# Patient Record
Sex: Female | Born: 1960 | Race: Black or African American | Hispanic: No | Marital: Single | Smoking: Never smoker
Health system: Southern US, Community
[De-identification: ages and names within clinical notes are randomized; demographics above are authoritative.]

## PROBLEM LIST (undated history)

## (undated) DIAGNOSIS — K219 Gastro-esophageal reflux disease without esophagitis: Secondary | ICD-10-CM

## (undated) DIAGNOSIS — D869 Sarcoidosis, unspecified: Secondary | ICD-10-CM

## (undated) HISTORY — PX: ABDOMINAL HYSTERECTOMY: SHX81

## (undated) HISTORY — DX: Gastro-esophageal reflux disease without esophagitis: K21.9

---

## 2007-09-25 ENCOUNTER — Encounter: Admission: RE | Admit: 2007-09-25 | Discharge: 2007-09-25 | Payer: Self-pay | Admitting: Family Medicine

## 2008-10-04 ENCOUNTER — Encounter: Admission: RE | Admit: 2008-10-04 | Discharge: 2008-10-04 | Payer: Self-pay | Admitting: Family Medicine

## 2008-10-13 ENCOUNTER — Encounter: Admission: RE | Admit: 2008-10-13 | Discharge: 2008-10-13 | Payer: Self-pay | Admitting: Family Medicine

## 2009-10-05 ENCOUNTER — Encounter: Admission: RE | Admit: 2009-10-05 | Discharge: 2009-10-05 | Payer: Self-pay | Admitting: Family Medicine

## 2009-11-11 ENCOUNTER — Encounter: Admission: RE | Admit: 2009-11-11 | Discharge: 2009-11-11 | Payer: Self-pay | Admitting: Podiatry

## 2010-10-10 ENCOUNTER — Encounter: Admission: RE | Admit: 2010-10-10 | Discharge: 2010-10-10 | Payer: Self-pay | Admitting: Family Medicine

## 2010-10-14 ENCOUNTER — Emergency Department (HOSPITAL_COMMUNITY): Admission: EM | Admit: 2010-10-14 | Discharge: 2010-10-14 | Payer: Self-pay | Admitting: Emergency Medicine

## 2011-04-03 ENCOUNTER — Other Ambulatory Visit: Payer: Self-pay | Admitting: Family Medicine

## 2011-04-03 DIAGNOSIS — Z1231 Encounter for screening mammogram for malignant neoplasm of breast: Secondary | ICD-10-CM

## 2011-10-14 ENCOUNTER — Ambulatory Visit
Admission: RE | Admit: 2011-10-14 | Discharge: 2011-10-14 | Disposition: A | Discharge status: Home / Self Care | Payer: 59 | Source: Ambulatory Visit | Attending: Family Medicine | Admitting: Family Medicine

## 2011-10-14 DIAGNOSIS — Z1231 Encounter for screening mammogram for malignant neoplasm of breast: Secondary | ICD-10-CM

## 2012-01-03 ENCOUNTER — Other Ambulatory Visit: Payer: Self-pay | Admitting: Family Medicine

## 2012-01-03 DIAGNOSIS — Z1231 Encounter for screening mammogram for malignant neoplasm of breast: Secondary | ICD-10-CM

## 2012-10-14 ENCOUNTER — Ambulatory Visit: Payer: 59

## 2012-10-19 ENCOUNTER — Ambulatory Visit
Admission: RE | Admit: 2012-10-19 | Discharge: 2012-10-19 | Disposition: A | Discharge status: Home / Self Care | Payer: 59 | Source: Ambulatory Visit | Attending: Family Medicine | Admitting: Family Medicine

## 2012-10-19 DIAGNOSIS — Z1231 Encounter for screening mammogram for malignant neoplasm of breast: Secondary | ICD-10-CM

## 2012-10-27 ENCOUNTER — Other Ambulatory Visit: Payer: Self-pay | Admitting: Family Medicine

## 2012-10-27 DIAGNOSIS — Z1231 Encounter for screening mammogram for malignant neoplasm of breast: Secondary | ICD-10-CM

## 2013-10-21 ENCOUNTER — Ambulatory Visit
Admission: RE | Admit: 2013-10-21 | Discharge: 2013-10-21 | Disposition: A | Discharge status: Home / Self Care | Payer: 59 | Source: Ambulatory Visit | Attending: Family Medicine | Admitting: Family Medicine

## 2013-10-21 DIAGNOSIS — Z1231 Encounter for screening mammogram for malignant neoplasm of breast: Secondary | ICD-10-CM

## 2014-01-04 ENCOUNTER — Other Ambulatory Visit: Payer: Self-pay

## 2014-01-04 DIAGNOSIS — Z1231 Encounter for screening mammogram for malignant neoplasm of breast: Secondary | ICD-10-CM

## 2014-10-21 ENCOUNTER — Ambulatory Visit: Payer: 59

## 2014-10-24 ENCOUNTER — Ambulatory Visit
Admission: RE | Admit: 2014-10-24 | Discharge: 2014-10-24 | Disposition: A | Discharge status: Home / Self Care | Payer: 59 | Source: Ambulatory Visit

## 2014-10-24 DIAGNOSIS — Z1231 Encounter for screening mammogram for malignant neoplasm of breast: Secondary | ICD-10-CM

## 2014-12-13 ENCOUNTER — Other Ambulatory Visit: Payer: Self-pay

## 2014-12-13 DIAGNOSIS — Z1231 Encounter for screening mammogram for malignant neoplasm of breast: Secondary | ICD-10-CM

## 2015-10-26 ENCOUNTER — Ambulatory Visit: Payer: 59

## 2015-10-30 ENCOUNTER — Ambulatory Visit
Admission: RE | Admit: 2015-10-30 | Discharge: 2015-10-30 | Disposition: A | Discharge status: Home / Self Care | Payer: 59 | Source: Ambulatory Visit

## 2015-10-30 DIAGNOSIS — Z1231 Encounter for screening mammogram for malignant neoplasm of breast: Secondary | ICD-10-CM

## 2015-11-14 ENCOUNTER — Other Ambulatory Visit: Payer: Self-pay

## 2015-11-14 DIAGNOSIS — Z1231 Encounter for screening mammogram for malignant neoplasm of breast: Secondary | ICD-10-CM

## 2016-03-18 ENCOUNTER — Ambulatory Visit
Admission: RE | Admit: 2016-03-18 | Discharge: 2016-03-18 | Disposition: A | Discharge status: Home / Self Care | Payer: 59 | Source: Ambulatory Visit | Attending: Family Medicine | Admitting: Family Medicine

## 2016-03-18 ENCOUNTER — Other Ambulatory Visit: Payer: Self-pay | Admitting: Family Medicine

## 2016-03-18 DIAGNOSIS — R059 Cough, unspecified: Secondary | ICD-10-CM

## 2016-03-18 DIAGNOSIS — R05 Cough: Secondary | ICD-10-CM

## 2016-10-31 ENCOUNTER — Ambulatory Visit
Admission: RE | Admit: 2016-10-31 | Discharge: 2016-10-31 | Disposition: A | Discharge status: Home / Self Care | Payer: 59 | Source: Ambulatory Visit

## 2016-10-31 DIAGNOSIS — Z1231 Encounter for screening mammogram for malignant neoplasm of breast: Secondary | ICD-10-CM

## 2016-11-05 ENCOUNTER — Other Ambulatory Visit: Payer: Self-pay | Admitting: Family Medicine

## 2016-11-05 DIAGNOSIS — N6310 Unspecified lump in the right breast, unspecified quadrant: Secondary | ICD-10-CM

## 2016-11-13 ENCOUNTER — Ambulatory Visit
Admission: RE | Admit: 2016-11-13 | Discharge: 2016-11-13 | Disposition: A | Discharge status: Home / Self Care | Payer: 59 | Source: Ambulatory Visit | Attending: Family Medicine | Admitting: Family Medicine

## 2016-11-13 DIAGNOSIS — N6310 Unspecified lump in the right breast, unspecified quadrant: Secondary | ICD-10-CM

## 2016-11-20 ENCOUNTER — Other Ambulatory Visit (HOSPITAL_COMMUNITY): Payer: Self-pay | Admitting: Respiratory Therapy

## 2016-11-20 DIAGNOSIS — D869 Sarcoidosis, unspecified: Secondary | ICD-10-CM

## 2016-12-02 ENCOUNTER — Ambulatory Visit (HOSPITAL_COMMUNITY)
Admission: RE | Admit: 2016-12-02 | Discharge: 2016-12-02 | Disposition: A | Discharge status: Home / Self Care | Payer: 59 | Source: Ambulatory Visit | Attending: Rheumatology | Admitting: Rheumatology

## 2016-12-02 DIAGNOSIS — D869 Sarcoidosis, unspecified: Secondary | ICD-10-CM | POA: Insufficient documentation

## 2016-12-02 LAB — SPIROMETRY WITH GRAPH
FEF 25-75 Pre: 1.18 L/sec
FEF2575-%Pred-Pre: 52 %
FEV1-%Pred-Pre: 78 %
FEV1-Pre: 1.72 L
FEV1FVC-%Pred-Pre: 91 %
FEV6-%Pred-Pre: 87 %
FEV6-Pre: 2.36 L
FEV6FVC-%Pred-Pre: 103 %
FVC-%Pred-Pre: 85 %
FVC-Pre: 2.36 L
Pre FEV1/FVC ratio: 73 %
Pre FEV6/FVC Ratio: 100 %

## 2017-01-02 ENCOUNTER — Ambulatory Visit: Payer: Self-pay | Admitting: Nurse Practitioner

## 2017-01-09 ENCOUNTER — Other Ambulatory Visit: Payer: Self-pay | Admitting: Family Medicine

## 2017-01-09 DIAGNOSIS — Z1231 Encounter for screening mammogram for malignant neoplasm of breast: Secondary | ICD-10-CM

## 2017-01-09 DIAGNOSIS — J069 Acute upper respiratory infection, unspecified: Secondary | ICD-10-CM | POA: Diagnosis not present

## 2017-01-09 DIAGNOSIS — J310 Chronic rhinitis: Secondary | ICD-10-CM | POA: Diagnosis not present

## 2017-01-23 DIAGNOSIS — M79672 Pain in left foot: Secondary | ICD-10-CM | POA: Diagnosis not present

## 2017-01-23 DIAGNOSIS — M79671 Pain in right foot: Secondary | ICD-10-CM | POA: Diagnosis not present

## 2017-01-23 DIAGNOSIS — B351 Tinea unguium: Secondary | ICD-10-CM | POA: Diagnosis not present

## 2017-01-30 ENCOUNTER — Ambulatory Visit: Payer: Self-pay | Admitting: Nurse Practitioner

## 2017-01-30 DIAGNOSIS — D860 Sarcoidosis of lung: Secondary | ICD-10-CM | POA: Diagnosis not present

## 2017-01-30 DIAGNOSIS — Z79899 Other long term (current) drug therapy: Secondary | ICD-10-CM | POA: Diagnosis not present

## 2017-01-30 DIAGNOSIS — D72819 Decreased white blood cell count, unspecified: Secondary | ICD-10-CM | POA: Diagnosis not present

## 2017-02-27 DIAGNOSIS — M79671 Pain in right foot: Secondary | ICD-10-CM | POA: Diagnosis not present

## 2017-02-27 DIAGNOSIS — M79672 Pain in left foot: Secondary | ICD-10-CM | POA: Diagnosis not present

## 2017-02-27 DIAGNOSIS — B351 Tinea unguium: Secondary | ICD-10-CM | POA: Diagnosis not present

## 2017-03-24 DIAGNOSIS — L03031 Cellulitis of right toe: Secondary | ICD-10-CM | POA: Diagnosis not present

## 2017-04-07 DIAGNOSIS — L97511 Non-pressure chronic ulcer of other part of right foot limited to breakdown of skin: Secondary | ICD-10-CM | POA: Diagnosis not present

## 2017-04-14 DIAGNOSIS — M3500 Sicca syndrome, unspecified: Secondary | ICD-10-CM | POA: Diagnosis not present

## 2017-04-14 DIAGNOSIS — Z79899 Other long term (current) drug therapy: Secondary | ICD-10-CM | POA: Diagnosis not present

## 2017-04-21 DIAGNOSIS — Z79899 Other long term (current) drug therapy: Secondary | ICD-10-CM | POA: Diagnosis not present

## 2017-04-21 DIAGNOSIS — D860 Sarcoidosis of lung: Secondary | ICD-10-CM | POA: Diagnosis not present

## 2017-04-21 DIAGNOSIS — D72819 Decreased white blood cell count, unspecified: Secondary | ICD-10-CM | POA: Diagnosis not present

## 2017-04-23 ENCOUNTER — Other Ambulatory Visit (HOSPITAL_COMMUNITY): Payer: Self-pay | Admitting: Respiratory Therapy

## 2017-04-23 DIAGNOSIS — D860 Sarcoidosis of lung: Secondary | ICD-10-CM

## 2017-04-28 DIAGNOSIS — L97511 Non-pressure chronic ulcer of other part of right foot limited to breakdown of skin: Secondary | ICD-10-CM | POA: Diagnosis not present

## 2017-04-30 ENCOUNTER — Institutional Professional Consult (permissible substitution): Payer: 59 | Admitting: Pulmonary Disease

## 2017-05-16 DIAGNOSIS — Z01419 Encounter for gynecological examination (general) (routine) without abnormal findings: Secondary | ICD-10-CM | POA: Diagnosis not present

## 2017-05-27 ENCOUNTER — Ambulatory Visit (INDEPENDENT_AMBULATORY_CARE_PROVIDER_SITE_OTHER)
Admission: RE | Admit: 2017-05-27 | Discharge: 2017-05-27 | Disposition: A | Discharge status: Home / Self Care | Payer: 59 | Source: Ambulatory Visit | Attending: Internal Medicine | Admitting: Internal Medicine

## 2017-05-27 ENCOUNTER — Ambulatory Visit (HOSPITAL_COMMUNITY)
Admission: RE | Admit: 2017-05-27 | Discharge: 2017-05-27 | Disposition: A | Discharge status: Home / Self Care | Payer: 59 | Source: Ambulatory Visit | Attending: Rheumatology | Admitting: Rheumatology

## 2017-05-27 ENCOUNTER — Encounter: Payer: Self-pay | Admitting: Internal Medicine

## 2017-05-27 ENCOUNTER — Encounter (HOSPITAL_COMMUNITY): Payer: 59

## 2017-05-27 ENCOUNTER — Ambulatory Visit (INDEPENDENT_AMBULATORY_CARE_PROVIDER_SITE_OTHER): Payer: 59 | Admitting: Internal Medicine

## 2017-05-27 VITALS — BP 108/64 | HR 65 | Ht 64.0 in | Wt 150.0 lb

## 2017-05-27 DIAGNOSIS — J449 Chronic obstructive pulmonary disease, unspecified: Secondary | ICD-10-CM | POA: Insufficient documentation

## 2017-05-27 DIAGNOSIS — D860 Sarcoidosis of lung: Secondary | ICD-10-CM | POA: Diagnosis present

## 2017-05-27 DIAGNOSIS — D869 Sarcoidosis, unspecified: Secondary | ICD-10-CM | POA: Diagnosis not present

## 2017-05-27 LAB — PULMONARY FUNCTION TEST
DL/VA % pred: 109 %
DL/VA: 5.26 ml/min/mmHg/L
DLCO unc % pred: 81 %
DLCO unc: 19.74 ml/min/mmHg
FEF 25-75 Pre: 1.2 L/sec
FEF2575-%Pred-Pre: 54 %
FEV1-%Pred-Pre: 72 %
FEV1-Pre: 1.59 L
FEV1FVC-%Pred-Pre: 88 %
FEV6-%Pred-Pre: 81 %
FEV6-Pre: 2.18 L
FEV6FVC-%Pred-Pre: 103 %
FVC-%Pred-Pre: 81 %
FVC-Pre: 2.25 L
Pre FEV1/FVC ratio: 71 %
Pre FEV6/FVC Ratio: 100 %

## 2017-05-27 NOTE — Progress Notes (Signed)
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 Pulmonary Consult    Referred by Dr. Dossie Morse. 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   all of the original symptoms that triggered intial evals dating back to age 32 have resolved at this  Point on just 200 mg plaquenil and Not limited by breathing from desired activities  / no cough   No obvious day to day or daytime variability or assoc excess/ purulent sputum or mucus plugs or hemoptysis or cp or chest tightness, subjective wheeze or overt sinus or hb symptoms. No unusual exp hx or h/o childhood pna/ asthma or knowledge of premature birth.  Sleeping ok without nocturnal  or early am exacerbation  of respiratory  c/o's or need for noct saba. Also denies any obvious fluctuation of symptoms with weather or environmental changes or other aggravating or alleviating factors except as outlined above   Current Medications, Allergies, Complete Past Medical History, Past Surgical History, Family History, and Social History were reviewed in [employer] record.  ROS  The following are not active complaints unless bolded sore throat, dysphagia, dental problems, itching, sneezing,  nasal congestion or excess/ purulent secretions, ear ache,   fever, chills, sweats, unintended wt loss, classically pleuritic or exertional cp,  orthopnea pnd or leg swelling, presyncope, palpitations, abdominal  Morse, anorexia, nausea, vomiting, diarrhea  or change in bowel or bladder habits, change in stools or urine, dysuria,hematuria,  rash, arthralgias, visual complaints, headache, numbness, weakness or ataxia or problems with walking or coordination,  change in mood/affect or memory.         Review of Systems     Objective:   Physical Exam     amb bf nad  Wt Readings from Last 3 Encounters:  05/27/17 150 lb (68 kg)    Vital signs reviewed - Note on arrival 02 sats  95% on RA      HEENT: nl dentition, turbinates bilaterally, and oropharynx. Nl external ear canals without cough reflex   NECK :  without JVD/Nodes/TM/ nl carotid upstrokes bilaterally   LUNGS: no acc muscle use,  Nl contour chest which is clear to A and P bilaterally without cough on insp or exp maneuvers   CV:  RRR  no s3 or murmur or increase in P2, and no edema   ABD:  soft and nontender with nl inspiratory excursion in the supine position. No bruits or organomegaly appreciated, bowel sounds nl  MS:  Nl gait/ ext warm without deformities, calf tenderness, cyanosis or clubbing No obvious joint restrictions   SKIN: warm and dry without lesions    NEURO:  alert, approp, nl sensorium with  no motor or cerebellar deficits apparent.   HEENT: nl dentition, turbinates bilaterally, and oropharynx. Nl external ear canals without cough reflex   NECK :  without JVD/Nodes/TM/ nl carotid upstrokes bilaterally   LUNGS: no acc muscle use,  Nl contour chest which is clear to A and P bilaterally without cough on insp or exp maneuvers   CV:  RRR  no s3 or murmur or increase in P2, and no edema   ABD:  soft and nontender with nl inspiratory excursion in the supine position. No bruits or organomegaly appreciated, bowel sounds nl  MS:  Nl gait/ ext warm without deformities, calf tenderness, cyanosis or clubbing No obvious joint restrictions   SKIN: warm and dry with one small linear plaque like lesion scalp line at  occiput maybe 0.5 x 2 cm in dimension, hyperpigmented and prev bx proven sarcoid per pt    NEURO:  alert, approp, nl sensorium with  no motor or cerebellar deficits apparent.     CXR PA and Lateral:   05/27/2017 :    I personally reviewed images and agree with radiology impression as follows:    minimal RN changes, min adenopathy/ no evidence active dz      Assessment:

## 2017-05-27 NOTE — Patient Instructions (Addendum)
Sarcoidosis is a benign inflammatory condition caused by  The  immune system being too revved up like a thermostat on your furnace that's partially  stuck causing arthritis, rash, short of breath and cough and vision issues.  It typically burns itself out in 75% of patients by the end of 3 years with little to indicate that we really change the natural course of the disease by aggressive treatments intended to alter it.   Treatment is generally reserved for patients with major symptoms we can attribute to sarcoid or if a vital organ (like the eye or kidney or nervous system) becomes affected.   Since you are mostly dealing with non-pulmonary manifestations and symptoms I recommend you let Dr Dossie Der dose your plaquenil using the lowest dose that works  Please remember to go to the x-ray department downstairs in the basement  for your tests - we will call you with the results when they are available.     Pulmonary follow as needed for an unexplained cough or breathing problem

## 2017-05-28 NOTE — Progress Notes (Signed)
Spoke with pt and notified of results per Dr. Wert. Pt verbalized understanding and denied any questions. 

## 2017-05-28 NOTE — Assessment & Plan Note (Addendum)
Onset 2000 predominantly sinus / tear duct related  - ACE level 05/24/16  55 on plaquenil 200 mg daily  - Spirometry 12/02/16  FVC 2.36 (85%) no obst  - Spirometry 05/27/2017  FEV1 2.25 (81%)  No obst with dlco 81 corrects to 109 for alv vol   ne small linear plaque like lesion scalp line at occiput maybe 0.5 x 2 cm in dimension, hyperpigmented and prev bx proven sarcoid per pt    A good rule of thumb is that >95% of pts with active sarcoid in any organ will have some plain cxr changes - on the other hand  if there are active pulmonary symptoms the cxr will look much worse than the patient:  No evidence of either scenario here/ strongly doubt active pulmonary dz and no need to change systemic rx  This many years into her illness it's hard to know how much systemic sarcoid there really is as there are no markers that are reliable or specific enough to dose the plaquenil more tightly - she does have a minimum elevation of ACE and a small plaque like area on neck that could be followed otherwise the asssesement is all clinical judgement and since there are no pulmonary signs/ symptoms it's entirely approp to defer this to rheum and pulmonary f/u can be prn  Total time devoted to counseling  > 50 % of initial 60 min office visit:  review case with pt/ discussion of options/alternatives/ personally creating written customized instructions  in presence of pt  then going over those specific  Instructions directly with the pt including how to use all of the meds but in particular covering each new medication in detail and the difference between the maintenance= "automatic" meds and the prns using an action plan format for the latter (If this problem/symptom => do that organization reading Left to right).  Please see AVS from this visit for a full list of these instructions which I personally wrote for this pt and  are unique to this visit.

## 2017-06-03 DIAGNOSIS — J324 Chronic pansinusitis: Secondary | ICD-10-CM | POA: Diagnosis not present

## 2017-06-03 DIAGNOSIS — J3489 Other specified disorders of nose and nasal sinuses: Secondary | ICD-10-CM | POA: Diagnosis not present

## 2017-06-03 DIAGNOSIS — D869 Sarcoidosis, unspecified: Secondary | ICD-10-CM | POA: Diagnosis not present

## 2017-06-03 DIAGNOSIS — J301 Allergic rhinitis due to pollen: Secondary | ICD-10-CM | POA: Diagnosis not present

## 2017-06-04 DIAGNOSIS — M722 Plantar fascial fibromatosis: Secondary | ICD-10-CM | POA: Diagnosis not present

## 2017-06-04 DIAGNOSIS — M65871 Other synovitis and tenosynovitis, right ankle and foot: Secondary | ICD-10-CM | POA: Diagnosis not present

## 2017-06-04 DIAGNOSIS — M25571 Pain in right ankle and joints of right foot: Secondary | ICD-10-CM | POA: Diagnosis not present

## 2017-06-04 DIAGNOSIS — M19071 Primary osteoarthritis, right ankle and foot: Secondary | ICD-10-CM | POA: Diagnosis not present

## 2017-06-13 DIAGNOSIS — Z0000 Encounter for general adult medical examination without abnormal findings: Secondary | ICD-10-CM | POA: Diagnosis not present

## 2017-06-13 DIAGNOSIS — Z13220 Encounter for screening for lipoid disorders: Secondary | ICD-10-CM | POA: Diagnosis not present

## 2017-06-13 DIAGNOSIS — Z23 Encounter for immunization: Secondary | ICD-10-CM | POA: Diagnosis not present

## 2017-06-19 DIAGNOSIS — M25571 Pain in right ankle and joints of right foot: Secondary | ICD-10-CM | POA: Diagnosis not present

## 2017-07-16 DIAGNOSIS — L309 Dermatitis, unspecified: Secondary | ICD-10-CM | POA: Diagnosis not present

## 2017-08-17 ENCOUNTER — Ambulatory Visit (HOSPITAL_COMMUNITY)
Admission: EM | Admit: 2017-08-17 | Discharge: 2017-08-17 | Disposition: A | Discharge status: Home / Self Care | Payer: 59 | Attending: Family Medicine | Admitting: Family Medicine

## 2017-08-17 ENCOUNTER — Ambulatory Visit (INDEPENDENT_AMBULATORY_CARE_PROVIDER_SITE_OTHER): Payer: 59

## 2017-08-17 ENCOUNTER — Encounter (HOSPITAL_COMMUNITY): Payer: Self-pay | Admitting: Family Medicine

## 2017-08-17 DIAGNOSIS — M79675 Pain in left toe(s): Secondary | ICD-10-CM

## 2017-08-17 DIAGNOSIS — W2203XA Walked into furniture, initial encounter: Secondary | ICD-10-CM

## 2017-08-17 DIAGNOSIS — S99292A Other physeal fracture of phalanx of left toe, initial encounter for closed fracture: Secondary | ICD-10-CM

## 2017-08-17 DIAGNOSIS — M7989 Other specified soft tissue disorders: Secondary | ICD-10-CM | POA: Diagnosis not present

## 2017-08-17 MED ORDER — HYDROCODONE-ACETAMINOPHEN 5-325 MG PO TABS: 1.0000 | ORAL_TABLET | Freq: Four times a day (QID) | ORAL | 0 refills | Status: DC | PRN

## 2017-08-17 NOTE — ED Triage Notes (Signed)
Pt here for left 5th toe swelling and bruising. Stumped on the night stand.

## 2017-08-17 NOTE — Discharge Instructions (Signed)
You have a small crack at the base of that little toe. This usually takes about 3 weeks to heal. During this time he should wear the postop shoe to keep the toe from bending. You may take the postop shoe off at night to sleep.  The bones are well aligned and should heal without having to resort to a cast.

## 2017-08-17 NOTE — ED Provider Notes (Signed)
[facility]    CSN: [id number] Arrival date & time: 08/17/17  [place]     History   Chief Complaint Chief Complaint  Patient presents with  . Toe Injury    HPI Kristy Morse is a 56 y.o. female.   Pt here for left 5th toe swelling and bruising. Stubbed it on the night stand earlier today..  Pain with weight bearing or walking  Patient works at a desk as an Scientist, physiological.      History reviewed. No pertinent past medical history.  Patient Active Problem List   Diagnosis Date Noted  . Sarcoidosis 05/27/2017    History reviewed. No pertinent surgical history.  OB History    No data available       Home Medications    Prior to Admission medications   Medication Sig Start Date End Date Taking? Authorizing Provider  Budesonide (RHINOCORT AQUA NA) Place 1 spray into the nose daily.    [provider]  HYDROcodone-acetaminophen (NORCO) 5-325 MG tablet Take 1 tablet by mouth every 6 (six) hours as needed for moderate pain. 08/17/17   Robyn Haber, MD  hydroxychloroquine (PLAQUENIL) 200 MG tablet Take 200 mg by mouth daily.    [provider]  ibandronate (BONIVA) 150 MG tablet Take 150 mg by mouth every 30 (thirty) days. Take in the morning with a full glass of water, on an empty stomach, and do not take anything else by mouth or lie down for the next 30 min.    [provider]    Family History Family History  Problem Relation Age of Onset  . Colon cancer Father   . Colon cancer Sister   . Colon cancer Brother     Social History Social History  Substance Use Topics  . Smoking status: Never Smoker  . Smokeless tobacco: Never Used  . Alcohol use No     Allergies   Doxycycline; Metronidazole; Septra [sulfamethoxazole-trimethoprim]; and Sulfa antibiotics   Review of Systems Review of Systems  Musculoskeletal: Positive for gait problem.  All other systems reviewed and are negative.    Physical Exam Triage Vital  Signs ED Triage Vitals  Enc Vitals Group     BP 08/17/17 1838 137/85     Pulse Rate 08/17/17 1838 65     Resp 08/17/17 1838 18     Temp 08/17/17 1838 98.5 F (36.9 C)     Temp src --      SpO2 08/17/17 1838 100 %     Weight --      Height --      Head Circumference --      Peak Flow --      Pain Score 08/17/17 1839 4     Pain Loc --      Pain Edu? --      Excl. in [place]? --    No data found.   Updated Vital Signs BP 137/85   Pulse 65   Temp 98.5 F (36.9 C)   Resp 18   SpO2 100%    Physical Exam  Constitutional: She is oriented to person, place, and time. She appears well-developed and well-nourished.  HENT:  Right Ear: External ear normal.  Left Ear: External ear normal.  Eyes: Pupils are equal, round, and reactive to light. Conjunctivae are normal.  Neck: Normal range of motion. Neck supple.  Pulmonary/Chest: Effort normal.  Musculoskeletal: She exhibits tenderness. She exhibits no deformity.  Swollen and ecchymotic left pinky toe with no  other pain in the foot. There is no deformity.  Neurological: She is alert and oriented to person, place, and time.  Skin: Skin is warm and dry.  Nursing note and vitals reviewed.    UC Treatments / Results  Labs (all labs ordered are listed, but only abnormal results are displayed) Labs Reviewed - No data to display  EKG  EKG Interpretation None       Radiology Dg Toe 5th Left  Result Date: 08/17/2017 CLINICAL DATA:  Swelling and bruising after hitting night stand EXAM: DG TOE 5TH LEFT:  3 V COMPARISON:  MR left foot November 11, 2009 FINDINGS: Frontal, oblique, and lateral views were obtained. There is soft tissue swelling. No evident fracture or dislocation. Joint spaces appear normal. IMPRESSION: Soft tissue swelling. No fracture or dislocation. No appreciable arthropathy. Electronically Signed   By: Lowella Grip III M.D.   On: 08/17/2017 19:12    Procedures Procedures (including critical care  time)  Medications Ordered in UC Medications - No data to display   Initial Impression / Assessment and Plan / UC Course  I have reviewed the triage vital signs and the nursing notes.  Pertinent labs & imaging results that were available during my care of the patient were reviewed by me and considered in my medical decision making (see chart for details).     Final Clinical Impressions(s) / UC Diagnoses   Final diagnoses:  Other physeal fracture of phalanx of left toe, initial encounter for closed fracture    New Prescriptions New Prescriptions   HYDROCODONE-ACETAMINOPHEN (NORCO) 5-325 MG TABLET    Take 1 tablet by mouth every 6 (six) hours as needed for moderate pain.     Controlled Substance Prescriptions [place] Controlled Substance Registry consulted? Not Applicable   Robyn Haber, MD 08/17/17 [phone]

## 2017-08-22 DIAGNOSIS — M71571 Other bursitis, not elsewhere classified, right ankle and foot: Secondary | ICD-10-CM | POA: Diagnosis not present

## 2017-08-22 DIAGNOSIS — M722 Plantar fascial fibromatosis: Secondary | ICD-10-CM | POA: Diagnosis not present

## 2017-08-22 DIAGNOSIS — M25572 Pain in left ankle and joints of left foot: Secondary | ICD-10-CM | POA: Diagnosis not present

## 2017-08-22 DIAGNOSIS — M12272 Villonodular synovitis (pigmented), left ankle and foot: Secondary | ICD-10-CM | POA: Diagnosis not present

## 2017-09-03 DIAGNOSIS — H43812 Vitreous degeneration, left eye: Secondary | ICD-10-CM | POA: Diagnosis not present

## 2017-09-03 DIAGNOSIS — L710 Perioral dermatitis: Secondary | ICD-10-CM | POA: Diagnosis not present

## 2017-09-03 DIAGNOSIS — L219 Seborrheic dermatitis, unspecified: Secondary | ICD-10-CM | POA: Diagnosis not present

## 2017-09-03 DIAGNOSIS — Z79899 Other long term (current) drug therapy: Secondary | ICD-10-CM | POA: Diagnosis not present

## 2017-09-08 DIAGNOSIS — M71572 Other bursitis, not elsewhere classified, left ankle and foot: Secondary | ICD-10-CM | POA: Diagnosis not present

## 2017-09-08 DIAGNOSIS — M25572 Pain in left ankle and joints of left foot: Secondary | ICD-10-CM | POA: Diagnosis not present

## 2017-10-06 DIAGNOSIS — M25571 Pain in right ankle and joints of right foot: Secondary | ICD-10-CM | POA: Diagnosis not present

## 2017-10-06 DIAGNOSIS — M2011 Hallux valgus (acquired), right foot: Secondary | ICD-10-CM | POA: Diagnosis not present

## 2017-10-06 DIAGNOSIS — M65871 Other synovitis and tenosynovitis, right ankle and foot: Secondary | ICD-10-CM | POA: Diagnosis not present

## 2017-10-08 DIAGNOSIS — Z79899 Other long term (current) drug therapy: Secondary | ICD-10-CM | POA: Diagnosis not present

## 2017-10-08 DIAGNOSIS — D860 Sarcoidosis of lung: Secondary | ICD-10-CM | POA: Diagnosis not present

## 2017-10-08 DIAGNOSIS — D72819 Decreased white blood cell count, unspecified: Secondary | ICD-10-CM | POA: Diagnosis not present

## 2017-10-23 DIAGNOSIS — B079 Viral wart, unspecified: Secondary | ICD-10-CM | POA: Diagnosis not present

## 2017-10-23 DIAGNOSIS — M2011 Hallux valgus (acquired), right foot: Secondary | ICD-10-CM | POA: Diagnosis not present

## 2017-10-23 DIAGNOSIS — M25571 Pain in right ankle and joints of right foot: Secondary | ICD-10-CM | POA: Diagnosis not present

## 2017-10-23 DIAGNOSIS — M79672 Pain in left foot: Secondary | ICD-10-CM | POA: Diagnosis not present

## 2017-10-29 DIAGNOSIS — M545 Low back pain: Secondary | ICD-10-CM | POA: Diagnosis not present

## 2017-11-03 ENCOUNTER — Ambulatory Visit
Admission: RE | Admit: 2017-11-03 | Discharge: 2017-11-03 | Disposition: A | Discharge status: Home / Self Care | Payer: 59 | Source: Ambulatory Visit | Attending: Family Medicine | Admitting: Family Medicine

## 2017-11-03 DIAGNOSIS — Z1231 Encounter for screening mammogram for malignant neoplasm of breast: Secondary | ICD-10-CM | POA: Diagnosis not present

## 2017-11-06 ENCOUNTER — Other Ambulatory Visit: Payer: Self-pay | Admitting: Family Medicine

## 2017-11-06 DIAGNOSIS — R928 Other abnormal and inconclusive findings on diagnostic imaging of breast: Secondary | ICD-10-CM

## 2017-11-06 DIAGNOSIS — M79672 Pain in left foot: Secondary | ICD-10-CM | POA: Diagnosis not present

## 2017-11-06 DIAGNOSIS — B079 Viral wart, unspecified: Secondary | ICD-10-CM | POA: Diagnosis not present

## 2017-11-12 ENCOUNTER — Ambulatory Visit: Payer: 59

## 2017-11-12 ENCOUNTER — Ambulatory Visit
Admission: RE | Admit: 2017-11-12 | Discharge: 2017-11-12 | Disposition: A | Discharge status: Home / Self Care | Payer: 59 | Source: Ambulatory Visit | Attending: Family Medicine | Admitting: Family Medicine

## 2017-11-12 DIAGNOSIS — R922 Inconclusive mammogram: Secondary | ICD-10-CM | POA: Diagnosis not present

## 2017-11-12 DIAGNOSIS — R928 Other abnormal and inconclusive findings on diagnostic imaging of breast: Secondary | ICD-10-CM

## 2017-11-26 DIAGNOSIS — Z79899 Other long term (current) drug therapy: Secondary | ICD-10-CM | POA: Diagnosis not present

## 2017-11-26 DIAGNOSIS — H43812 Vitreous degeneration, left eye: Secondary | ICD-10-CM | POA: Diagnosis not present

## 2017-11-26 DIAGNOSIS — D869 Sarcoidosis, unspecified: Secondary | ICD-10-CM | POA: Diagnosis not present

## 2017-11-26 DIAGNOSIS — H10413 Chronic giant papillary conjunctivitis, bilateral: Secondary | ICD-10-CM | POA: Diagnosis not present

## 2017-11-28 DIAGNOSIS — M79672 Pain in left foot: Secondary | ICD-10-CM | POA: Diagnosis not present

## 2017-11-28 DIAGNOSIS — B079 Viral wart, unspecified: Secondary | ICD-10-CM | POA: Diagnosis not present

## 2017-12-03 DIAGNOSIS — D485 Neoplasm of uncertain behavior of skin: Secondary | ICD-10-CM | POA: Diagnosis not present

## 2017-12-03 DIAGNOSIS — D869 Sarcoidosis, unspecified: Secondary | ICD-10-CM | POA: Diagnosis not present

## 2017-12-03 DIAGNOSIS — L308 Other specified dermatitis: Secondary | ICD-10-CM | POA: Diagnosis not present

## 2017-12-03 DIAGNOSIS — L219 Seborrheic dermatitis, unspecified: Secondary | ICD-10-CM | POA: Diagnosis not present

## 2017-12-03 DIAGNOSIS — L710 Perioral dermatitis: Secondary | ICD-10-CM | POA: Diagnosis not present

## 2017-12-15 DIAGNOSIS — B079 Viral wart, unspecified: Secondary | ICD-10-CM | POA: Diagnosis not present

## 2017-12-29 ENCOUNTER — Other Ambulatory Visit: Payer: Self-pay | Admitting: Family Medicine

## 2017-12-29 DIAGNOSIS — Z1231 Encounter for screening mammogram for malignant neoplasm of breast: Secondary | ICD-10-CM

## 2018-02-06 DIAGNOSIS — B349 Viral infection, unspecified: Secondary | ICD-10-CM | POA: Diagnosis not present

## 2018-02-06 DIAGNOSIS — J029 Acute pharyngitis, unspecified: Secondary | ICD-10-CM | POA: Diagnosis not present

## 2018-02-26 DIAGNOSIS — L219 Seborrheic dermatitis, unspecified: Secondary | ICD-10-CM | POA: Diagnosis not present

## 2018-02-26 DIAGNOSIS — D869 Sarcoidosis, unspecified: Secondary | ICD-10-CM | POA: Diagnosis not present

## 2018-04-12 IMAGING — DX DG TOE 5TH 2+V*L*
3 series · 3 of 3 positions shown · non-contrast
Comparison: MR [place] foot November 11, 2009

CLINICAL DATA: Swelling and bruising after hitting night stand

EXAM:
DG TOE 5TH LEFT:  3 V

[toe ap]
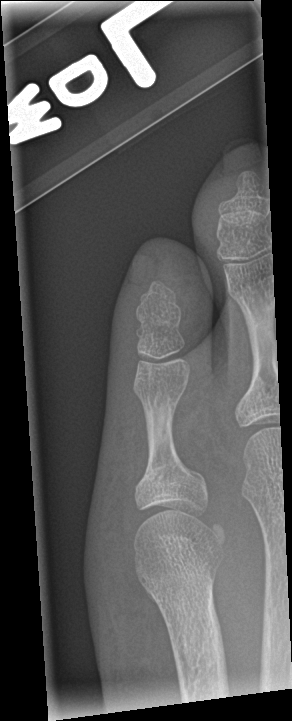

[toe obl]
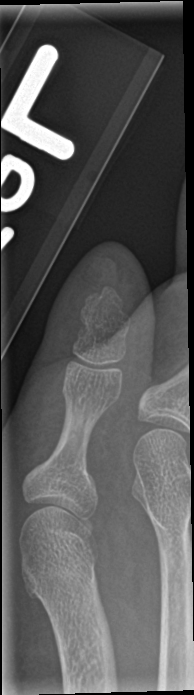

[toe lat]
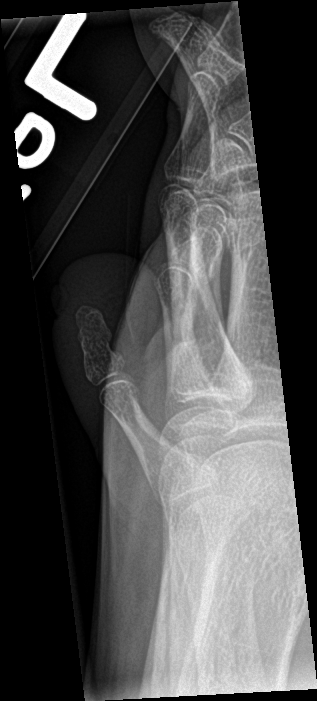

[3 of 3 positions shown; findings below may reference images not displayed]

FINDINGS: Frontal, oblique, and lateral views were obtained. There is soft
tissue swelling. No evident fracture or dislocation. Joint spaces
appear normal.
IMPRESSION: Soft tissue swelling. No fracture or dislocation. No appreciable
arthropathy.

## 2018-05-20 DIAGNOSIS — Z01419 Encounter for gynecological examination (general) (routine) without abnormal findings: Secondary | ICD-10-CM | POA: Diagnosis not present

## 2018-05-27 DIAGNOSIS — D869 Sarcoidosis, unspecified: Secondary | ICD-10-CM | POA: Diagnosis not present

## 2018-05-27 DIAGNOSIS — L219 Seborrheic dermatitis, unspecified: Secondary | ICD-10-CM | POA: Diagnosis not present

## 2018-06-15 DIAGNOSIS — Z79899 Other long term (current) drug therapy: Secondary | ICD-10-CM | POA: Diagnosis not present

## 2018-06-15 DIAGNOSIS — D860 Sarcoidosis of lung: Secondary | ICD-10-CM | POA: Diagnosis not present

## 2018-06-15 DIAGNOSIS — D72819 Decreased white blood cell count, unspecified: Secondary | ICD-10-CM | POA: Diagnosis not present

## 2018-06-16 DIAGNOSIS — D869 Sarcoidosis, unspecified: Secondary | ICD-10-CM | POA: Diagnosis not present

## 2018-06-16 DIAGNOSIS — Z0000 Encounter for general adult medical examination without abnormal findings: Secondary | ICD-10-CM | POA: Diagnosis not present

## 2018-06-16 DIAGNOSIS — Z13220 Encounter for screening for lipoid disorders: Secondary | ICD-10-CM | POA: Diagnosis not present

## 2018-06-23 DIAGNOSIS — Z79899 Other long term (current) drug therapy: Secondary | ICD-10-CM | POA: Diagnosis not present

## 2018-06-23 DIAGNOSIS — H43812 Vitreous degeneration, left eye: Secondary | ICD-10-CM | POA: Diagnosis not present

## 2018-06-23 DIAGNOSIS — H43811 Vitreous degeneration, right eye: Secondary | ICD-10-CM | POA: Diagnosis not present

## 2018-07-16 DIAGNOSIS — M12271 Villonodular synovitis (pigmented), right ankle and foot: Secondary | ICD-10-CM | POA: Diagnosis not present

## 2018-07-16 DIAGNOSIS — M25571 Pain in right ankle and joints of right foot: Secondary | ICD-10-CM | POA: Diagnosis not present

## 2018-07-22 DIAGNOSIS — H43812 Vitreous degeneration, left eye: Secondary | ICD-10-CM | POA: Diagnosis not present

## 2018-07-22 DIAGNOSIS — H43811 Vitreous degeneration, right eye: Secondary | ICD-10-CM | POA: Diagnosis not present

## 2018-07-22 DIAGNOSIS — Z79899 Other long term (current) drug therapy: Secondary | ICD-10-CM | POA: Diagnosis not present

## 2018-08-06 ENCOUNTER — Encounter: Payer: Self-pay | Admitting: Internal Medicine

## 2018-08-06 ENCOUNTER — Ambulatory Visit (INDEPENDENT_AMBULATORY_CARE_PROVIDER_SITE_OTHER)
Admission: RE | Admit: 2018-08-06 | Discharge: 2018-08-06 | Disposition: A | Discharge status: Home / Self Care | Payer: 59 | Source: Ambulatory Visit | Attending: Internal Medicine | Admitting: Internal Medicine

## 2018-08-06 ENCOUNTER — Ambulatory Visit (INDEPENDENT_AMBULATORY_CARE_PROVIDER_SITE_OTHER): Payer: 59 | Admitting: Internal Medicine

## 2018-08-06 VITALS — BP 130/72 | HR 73 | Ht 64.0 in | Wt 159.8 lb

## 2018-08-06 DIAGNOSIS — D869 Sarcoidosis, unspecified: Secondary | ICD-10-CM

## 2018-08-06 DIAGNOSIS — R0602 Shortness of breath: Secondary | ICD-10-CM

## 2018-08-06 NOTE — Patient Instructions (Addendum)
Try off boniva  For now and continue regular walking at a pace where you are slightly short of breath but never out of breath   Please remember to go to the  x-ray department downstairs in the basement  for your tests - we will call you with the results when they are available.      Please schedule a follow up office visit in 6 weeks, call sooner if needed with PFTs on return

## 2018-08-06 NOTE — Progress Notes (Signed)
Subjective:     Patient ID: Kristy Morse, female   DOB: 12/11/1961,     MRN: [id number]     Brief patient profile:  57 yobf never smoker with sinus problems since her 57's got worse >  ENT eval around age 57 found sarcoid changes > rx nasal sparys helped a lot then moved to [facility] 2008 with new tearing eyes > Dr Jerline Pain eval with ocular sarcoid / tearduct obst rx plaquenil by [facility] > improved p tearduct surgery and contineud plaquenil since then > referred to [facility] 2017 > cxr and pft done and referred to pulmonary clinic 05/27/2017 by Dr   Dossie Der     History of Present Illness  05/27/2017 1st [facility] office visit/ Kristy Morse   Chief Complaint  Patient presents with  . Pulmonary Consult    Referred by Dr. Dossie Der. 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   all of the original symptoms that triggered intial evals dating back to age 6 have resolved at this  Point on just 200 mg plaquenil and Not limited by breathing from desired activities  / no cough  rec No indication for treatment     08/06/2018   extended ov/Kristy Morse [id number] eyes/skin  new sob at rest not reproduced with ex  Chief Complaint  Patient presents with  . Follow-up    increased SOB x 1 month- comes and goes and is mild.   feels fine walking level x 30 min x fast pace x 2x daily  /  Sob occurs at rest, better p 2 breaths/ not at hs Onset x one month prior to OV ,  comes on sev times a day, never sleeping/ no clear trigger  No cough / rash about same on plaquenil 200 mg daily   No obvious day to day or daytime variability or assoc excess/ purulent sputum or mucus plugs or hemoptysis or cp or chest tightness, subjective wheeze or overt sinus or hb symptoms.   Sleeping fien flat without nocturnal  or early am exacerbation  of respiratory  c/o's or need for noct saba. Also denies any obvious fluctuation of symptoms with weather or environmental changes or other aggravating or alleviating factors except as outlined above   No unusual  exposure hx or h/o childhood pna/ asthma or knowledge of premature birth.  Current Allergies, Complete Past Medical History, Past Surgical History, Family History, and Social History were reviewed in [employer] record.  ROS  The following are not active complaints unless bolded Hoarseness, sore throat, dysphagia, dental problems, itching, sneezing,  nasal congestion or discharge of excess mucus or purulent secretions, ear ache,   fever, chills, sweats, unintended wt loss or wt gain, classically pleuritic or exertional cp,  orthopnea pnd or arm/hand swelling  or leg swelling, presyncope, palpitations, abdominal pain, anorexia, nausea, vomiting, diarrhea  or change in bowel habits or change in bladder habits, change in stools or change in urine, dysuria, hematuria,  rash, arthralgias, visual complaints, headache, numbness, weakness or ataxia or problems with walking or coordination,  change in mood or  memory.        Current Meds  Medication Sig  . Budesonide (RHINOCORT AQUA NA) Place 1 spray into the nose daily.  Marland Kitchen HYDROcodone-acetaminophen (NORCO) 5-325 MG tablet Take 1 tablet by mouth every 6 (six) hours as needed for moderate pain.  . hydroxychloroquine (PLAQUENIL) 200 MG tablet Take 200 mg by mouth daily.  . [DISCONTINUED] ibandronate (BONIVA) 150 MG tablet Take 150 mg  by mouth every 30 (thirty) days. Take in the morning with a full glass of water, on an empty stomach, and do not take anything else by mouth or lie down for the next 30 min.                  Objective:   Physical Exam    amb bf nad     Wt Readings from Last 3 Encounters:  08/06/18 159 lb 12.8 oz (72.5 kg)  05/27/17 150 lb (68 kg)     Vital signs reviewed - Note on arrival 02 sats  98% on RA       HEENT: nl dentition, turbinates bilaterally, and oropharynx. Nl external ear canals without cough reflex   NECK :  without JVD/Nodes/TM/ nl carotid upstrokes bilaterally   LUNGS: no acc  muscle use,  Nl contour chest which is clear to A and P bilaterally without cough on insp or exp maneuvers   CV:  RRR  no s3 or murmur or increase in P2, and no edema   ABD:  soft and nontender with nl inspiratory excursion in the supine position. No bruits or organomegaly appreciated, bowel sounds nl  MS:  Nl gait/ ext warm without deformities, calf tenderness, cyanosis or clubbing No obvious joint restrictions   SKIN: warm and dry with purplish plaque neck post at hairline/  R jawline L elbow, R knee   NEURO:  alert, approp, nl sensorium with  no motor or cerebellar deficits apparent.       CXR PA and Lateral:   08/06/2018 :    I personally reviewed images and  impression as follows:    min hilar adenopathy/ no progressive ILD   Labs per Dr Valli Glance: ok per pt one m prior to OV         Assessment:

## 2018-08-07 ENCOUNTER — Telehealth: Payer: Self-pay | Admitting: Internal Medicine

## 2018-08-07 ENCOUNTER — Encounter: Payer: Self-pay | Admitting: Internal Medicine

## 2018-08-07 DIAGNOSIS — R0602 Shortness of breath: Secondary | ICD-10-CM | POA: Insufficient documentation

## 2018-08-07 NOTE — Progress Notes (Signed)
lmtcb

## 2018-08-07 NOTE — Assessment & Plan Note (Addendum)
Onset 2000 predominantly sinus / tear duct related  - ACE level 05/24/16  55 on plaquenil 200 mg daily  - Spirometry 12/02/16  FVC 2.36 (85%) no obst  - Spirometry 05/27/2017  FEV1 2.25 (81%)  No obst with dlco 81 corrects to 109 for alv vol   - 05/27/2017 ne small linear plaque like lesion scalp line at occiput maybe 0.5 x 2 cm in dimension, hyperpigmented and prev bx proven sarcoid per pt   - 08/06/2018  Walked RA x 3 laps @ 185 ft each stopped due to  End of study, fast pace, no  desat  / min sob   A good rule of thumb is that >95% of pts with active sarcoidactive pulmonary symptoms the cxr will look much worse than the patient in any organ will have some plain cxr changes - on the other hand  if there are :  No evidence of either scenario here/ strongly doubt active dz     Symptoms are not c/w progressive sarcoid or likely to be related to any cardiopulmonary issue as sob lasts a breath or two at rest then resolve sleeping and with exercise c/w anxiety    rec regular sub max exercise/ reassurance > no change in meds for sarcoid and f/u Syed planned

## 2018-08-07 NOTE — Assessment & Plan Note (Addendum)
See comments re sarcoid, which this does not represent   Symptoms are markedly disproportionate to objective findings and not clear to what extent this is actually a pulmonary  problem but pt does appear to have difficult to sort out respiratory symptoms of unknown origin for which  DDX  = almost all start with A and  include Adherence, Ace Inhibitors, Acid Reflux, Active Sinus Disease, Alpha 1 Antitripsin deficiency, Anxiety masquerading as Airways dz,  ABPA,  Allergy(esp in young), Aspiration (esp in elderly), Adverse effects of meds,  Active smokers, A bunch of PE's/clot burden (a few small clots can't cause this syndrome unless there is already severe underlying pulm or vascular dz with poor reserve),  Anemia or thyroid disorder, plus two Bs  = Bronchiectasis and Beta blocker use..and one C= CHF     Acid reflux can cause sense of sob in pts not reproduced with ex > try off boniva x 2 doses then rtc for full pfts  Anxiety the most likely source > reassurance   Anemia/ thryoid dz > recent labs neg per Dr Valli Glance   I had an extended discussion with the patient reviewing all relevant studies completed to date and  lasting 15 to 20 minutes of a 25 minute visit    Each maintenance medication was reviewed in detail including most importantly the difference between maintenance and prns and under what circumstances the prns are to be triggered using an action plan format that is not reflected in the computer generated alphabetically organized AVS.    Please see AVS for specific instructions unique to this visit that I personally wrote and verbalized to the the pt in detail and then reviewed with pt  by my nurse highlighting any  changes in therapy recommended at today's visit to their plan of care.

## 2018-08-07 NOTE — Telephone Encounter (Signed)
Spoke with the pt and advised her of the cxr results  She verbalized understanding  Nothing further needed

## 2018-08-12 DIAGNOSIS — D869 Sarcoidosis, unspecified: Secondary | ICD-10-CM | POA: Diagnosis not present

## 2018-08-12 DIAGNOSIS — D8689 Sarcoidosis of other sites: Secondary | ICD-10-CM | POA: Diagnosis not present

## 2018-08-12 DIAGNOSIS — J324 Chronic pansinusitis: Secondary | ICD-10-CM | POA: Diagnosis not present

## 2018-08-12 DIAGNOSIS — J3489 Other specified disorders of nose and nasal sinuses: Secondary | ICD-10-CM | POA: Diagnosis not present

## 2018-09-01 DIAGNOSIS — H43813 Vitreous degeneration, bilateral: Secondary | ICD-10-CM | POA: Diagnosis not present

## 2018-09-01 DIAGNOSIS — H35463 Secondary vitreoretinal degeneration, bilateral: Secondary | ICD-10-CM | POA: Diagnosis not present

## 2018-09-01 DIAGNOSIS — H31092 Other chorioretinal scars, left eye: Secondary | ICD-10-CM | POA: Diagnosis not present

## 2018-09-11 ENCOUNTER — Other Ambulatory Visit: Payer: Self-pay | Admitting: Internal Medicine

## 2018-09-11 DIAGNOSIS — R0602 Shortness of breath: Secondary | ICD-10-CM

## 2018-09-11 DIAGNOSIS — D869 Sarcoidosis, unspecified: Secondary | ICD-10-CM

## 2018-09-14 ENCOUNTER — Ambulatory Visit: Payer: 59 | Admitting: Internal Medicine

## 2018-09-14 ENCOUNTER — Ambulatory Visit (INDEPENDENT_AMBULATORY_CARE_PROVIDER_SITE_OTHER): Payer: 59 | Admitting: Internal Medicine

## 2018-09-14 DIAGNOSIS — R0602 Shortness of breath: Secondary | ICD-10-CM

## 2018-09-14 DIAGNOSIS — D869 Sarcoidosis, unspecified: Secondary | ICD-10-CM

## 2018-09-14 DIAGNOSIS — R635 Abnormal weight gain: Secondary | ICD-10-CM | POA: Diagnosis not present

## 2018-09-14 DIAGNOSIS — L679 Hair color and hair shaft abnormality, unspecified: Secondary | ICD-10-CM | POA: Diagnosis not present

## 2018-09-14 DIAGNOSIS — R5383 Other fatigue: Secondary | ICD-10-CM | POA: Diagnosis not present

## 2018-09-14 LAB — PULMONARY FUNCTION TEST
DL/VA % pred: 112 %
DL/VA: 5.11 ml/min/mmHg/L
DLCO unc % pred: 96 %
DLCO unc: 20.89 ml/min/mmHg
FEF 25-75 Post: 2.07 L/sec
FEF 25-75 Pre: 1.52 L/sec
FEF2575-%Change-Post: 36 %
FEF2575-%Pred-Post: 102 %
FEF2575-%Pred-Pre: 75 %
FEV1-%Change-Post: 5 %
FEV1-%Pred-Post: 99 %
FEV1-%Pred-Pre: 94 %
FEV1-Post: 1.95 L
FEV1-Pre: 1.85 L
FEV1FVC-%Change-Post: 3 %
FEV1FVC-%Pred-Pre: 99 %
FEV6-%Change-Post: 2 %
FEV6-%Pred-Post: 99 %
FEV6-%Pred-Pre: 97 %
FEV6-Post: 2.39 L
FEV6-Pre: 2.34 L
FEV6FVC-%Change-Post: 0 %
FEV6FVC-%Pred-Post: 104 %
FEV6FVC-%Pred-Pre: 104 %
FVC-%Change-Post: 2 %
FVC-%Pred-Post: 95 %
FVC-%Pred-Pre: 93 %
FVC-Post: 2.39 L
FVC-Pre: 2.34 L
Post FEV1/FVC ratio: 82 %
Post FEV6/FVC ratio: 100 %
Pre FEV1/FVC ratio: 79 %
Pre FEV6/FVC Ratio: 100 %
RV % pred: 107 %
RV: 1.99 L
TLC % pred: 95 %
TLC: 4.53 L

## 2018-09-14 NOTE — Progress Notes (Signed)
PFT completed today. 09/14/18  

## 2018-09-15 ENCOUNTER — Ambulatory Visit (INDEPENDENT_AMBULATORY_CARE_PROVIDER_SITE_OTHER): Payer: 59 | Admitting: Internal Medicine

## 2018-09-15 ENCOUNTER — Encounter: Payer: Self-pay | Admitting: Internal Medicine

## 2018-09-15 VITALS — BP 122/64 | HR 78 | Ht 63.5 in | Wt 158.0 lb

## 2018-09-15 DIAGNOSIS — D869 Sarcoidosis, unspecified: Secondary | ICD-10-CM | POA: Diagnosis not present

## 2018-09-15 NOTE — Progress Notes (Signed)
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 Pulmonary Consult    Referred by Dr. Dossie Der. 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   all of the original symptoms that triggered intial evals dating back to age 57 have resolved at this  Point on just 200 mg plaquenil and Not limited by breathing from desired activities  / no cough  rec No indication for treatment     08/06/2018   extended ov/Beola Vasallo [id number] eyes/skin  new sob at rest not reproduced with ex  Chief Complaint  Patient presents with  . Follow-up    increased SOB x 1 month- comes and goes and is mild.   feels fine walking level x 30 min x fast pace x 2x daily  /  Sob occurs at rest, better p 2 breaths/ not at hs Onset x one month prior to OV ,  comes on sev times a day, never sleeping/ no clear trigger  No cough / rash about same on plaquenil 200 mg daily  rec Try off boniva  For now and continue regular walking at a pace where you are slightly short of breath but never out of breath  Please remember to go to the  x-ray department downstairs in the basement  for your tests - we will call you with the results when they are available.   Please schedule a follow up office visit in 6 weeks, call sooner if needed with PFTs on return    09/15/2018  f/u ov/Rever Pichette re: sarcoid with skin/ eye / still on  boniva, sob resolved  Chief Complaint  Patient presents with  . Follow-up    PFT's done on 09/14/18. She states her breathing has improved. No new co's.   Dyspnea:  Ex x 30 min s difficulty  Cough: no Sleeping: 2 pillows/ bed flat  SABA use: none 02: none   No obvious day to day or daytime variability or assoc excess/ purulent sputum or mucus plugs or hemoptysis or cp or chest tightness, subjective wheeze or overt sinus or hb symptoms.   Sleeps as abov e without nocturnal  or early am exacerbation  of respiratory  c/o's or need for noct saba. Also denies any obvious fluctuation of symptoms with weather or environmental changes or other aggravating or alleviating factors except as outlined above   No unusual exposure hx or h/o childhood pna/ asthma or knowledge of premature birth.  Current Allergies, Complete Past Medical History, Past Surgical History, Family History, and Social History were reviewed in [employer] record.  ROS  The following are not active complaints unless bolded Hoarseness, sore throat, dysphagia, dental problems, itching, sneezing,  nasal congestion or discharge of excess mucus or  purulent secretions, ear ache,   fever, chills, sweats, unintended wt loss or wt gain, classically pleuritic or exertional cp,  orthopnea pnd or arm/hand swelling  or leg swelling, presyncope, palpitations, abdominal pain, anorexia, nausea, vomiting, diarrhea  or change in bowel habits or change in bladder habits, change in stools or change in urine, dysuria, hematuria,  rash, arthralgias, visual complaints, headache, numbness, weakness or ataxia or problems with walking or coordination,  change in mood or  memory.        Current Meds  Medication Sig  . fluticasone (FLONASE) 50 MCG/ACT nasal spray Place 2 sprays into both nostrils daily.  . hydroxychloroquine (PLAQUENIL) 200 MG tablet Take 200 mg by mouth daily.  Marland Kitchen ibandronate (BONIVA) 150 MG tablet Take 150 mg by mouth  every 30 (thirty) days. Take in the morning with a full glass of water, on an empty stomach, and do not take anything else by mouth or lie down for the next 30 min.                 Objective:   Physical Exam    amb bf nad      09/15/2018      158   08/06/18 159 lb 12.8 oz (72.5 kg)  05/27/17 150 lb (68 kg)      Vital signs reviewed - Note on arrival 02 sats  99% on RA         purplish plaques neck post at hairline and L elbow, R knee  -  the most obvious  2 x 1 cm below R mandible  HEENT: nl dentition, turbinates bilaterally, and oropharynx. Nl external ear canals without cough reflex   NECK :  without JVD/Nodes/TM/ nl carotid upstrokes bilaterally   LUNGS: no acc muscle use,  Nl contour chest which is clear to A and P bilaterally without cough on insp or exp maneuvers   CV:  RRR  no s3 or murmur or increase in P2, and no edema   ABD:  soft and nontender with nl inspiratory excursion in the supine position. No bruits or organomegaly appreciated, bowel sounds nl  MS:  Nl gait/ ext warm without deformities, calf tenderness, cyanosis or clubbing No obvious joint restrictions   SKIN: warm and dry with classic purplish flesy plaques L elbor, R knee, neck posteriorly at hariline, and 2x1 cm R neck just below mandible     NEURO:  alert, approp, nl sensorium with  no motor or cerebellar deficits apparent.                  Assessment:

## 2018-09-15 NOTE — Patient Instructions (Signed)
If breathing or coughing worse, try off boniva first then return to this clinic after a month off it.     Otherwise return as needed

## 2018-09-17 ENCOUNTER — Encounter: Payer: Self-pay | Admitting: Internal Medicine

## 2018-09-17 NOTE — Assessment & Plan Note (Signed)
Onset 2000 predominantly sinus / tear duct related  - ACE level 05/24/16  55 on plaquenil 200 mg daily  - Spirometry 12/02/16  FVC 2.36 (85%) no obst  - Spirometry 05/27/2017  FEV1 2.25 (81%)  No obst with dlco 81 corrects to 109 for alv vol   - 05/27/2017 ne small linear plaque like lesion scalp line at occiput maybe 0.5 x 2 cm in dimension, hyperpigmented and prev bx proven sarcoid per pt   - 08/06/2018  Walked RA x 3 laps @ 185 ft each stopped due to  End of study, fast pace, no  desat  / min sob    - PFT's  09/15/2018  FEV1 1.95 (99 % ) ratio 82  p 5 % improvement from saba p nothing prior to study with DLCO  96 % corrects to 112  % for alv volume  With FVC 2.34    Again no evidence of active pulmonary sarcoid with just skin and tear duct involvement not worse on plaquenil at 200 mg per day and could increase to 400 mg q day if needed per Dr Dossie Der f/u  If new resp symptoms develop at this point, it would be much more likely related to uacs from boniva so should try off that 1st.  Pulmonary f/u is prn    I had an extended discussion with the patient reviewing all relevant studies completed to date and  lasting 15 to 20 minutes of a 25 minute visit    Each maintenance medication was reviewed in detail including most importantly the difference between maintenance and prns and under what circumstances the prns are to be triggered using an action plan format that is not reflected in the computer generated alphabetically organized AVS.     Please see AVS for specific instructions unique to this visit that I personally wrote and verbalized to the the pt in detail and then reviewed with pt  by my nurse highlighting any  changes in therapy recommended at today's visit to their plan of care.

## 2018-11-09 ENCOUNTER — Ambulatory Visit: Payer: 59

## 2018-11-12 DIAGNOSIS — D869 Sarcoidosis, unspecified: Secondary | ICD-10-CM | POA: Diagnosis not present

## 2018-11-12 DIAGNOSIS — L219 Seborrheic dermatitis, unspecified: Secondary | ICD-10-CM | POA: Diagnosis not present

## 2018-11-23 ENCOUNTER — Ambulatory Visit (HOSPITAL_COMMUNITY)
Admission: EM | Admit: 2018-11-23 | Discharge: 2018-11-23 | Disposition: A | Discharge status: Home / Self Care | Payer: 59 | Attending: Family Medicine | Admitting: Family Medicine

## 2018-11-23 ENCOUNTER — Encounter (HOSPITAL_COMMUNITY): Payer: Self-pay

## 2018-11-23 ENCOUNTER — Other Ambulatory Visit: Payer: Self-pay

## 2018-11-23 DIAGNOSIS — M79644 Pain in right finger(s): Secondary | ICD-10-CM | POA: Diagnosis not present

## 2018-11-23 DIAGNOSIS — Z79899 Other long term (current) drug therapy: Secondary | ICD-10-CM | POA: Diagnosis not present

## 2018-11-23 NOTE — ED Triage Notes (Signed)
Pt states she was opening the ironing bored  and all of a sudden she got a pain finger.

## 2018-11-23 NOTE — Discharge Instructions (Signed)
Please try to ice your finger  Please perform range of motion exercises  Please follow up in 1-2 weeks if your symptoms are not improved.

## 2018-11-23 NOTE — ED Provider Notes (Signed)
[facility]    CSN: [id number] Arrival date & time: 11/23/18  1646     History   Chief Complaint Chief Complaint  Patient presents with  . Hand Pain    HPI Kristy Morse is a 57 y.o. female.   She is presenting today with pain along the flexor surface of her fifth pinky finger on her right hand.  She was opening her iron board and felt a pop today.  She has had some ecchymosis along the flexor surface.  She denies any loss of function.  She denies any significant pain.  She she is right-handed.  She does office work.  HPI  History reviewed. No pertinent past medical history.  Patient Active Problem List   Diagnosis Date Noted  . SOB (shortness of breath) 08/07/2018  . Sarcoidosis 05/27/2017    History reviewed. No pertinent surgical history.  OB History   None      Home Medications    Prior to Admission medications   Medication Sig Start Date End Date Taking? Authorizing Provider  fluticasone (FLONASE) 50 MCG/ACT nasal spray Place 2 sprays into both nostrils daily. 08/25/18   [provider]  hydroxychloroquine (PLAQUENIL) 200 MG tablet Take 200 mg by mouth daily.    [provider]  ibandronate (BONIVA) 150 MG tablet Take 150 mg by mouth every 30 (thirty) days. Take in the morning with a full glass of water, on an empty stomach, and do not take anything else by mouth or lie down for the next 30 min.    [provider]    Family History Family History  Problem Relation Age of Onset  . Colon cancer Father   . Colon cancer Sister   . Colon cancer Brother     Social History Social History   Tobacco Use  . Smoking status: Never Smoker  . Smokeless tobacco: Never Used  Substance Use Topics  . Alcohol use: No  . Drug use: No     Allergies   Doxycycline; Metronidazole; Septra [sulfamethoxazole-trimethoprim]; and Sulfa antibiotics   Review of Systems Review of Systems  Constitutional: Negative for fever.  HENT:  Negative for congestion.   Respiratory: Negative for cough.   Cardiovascular: Negative for chest pain.  Gastrointestinal: Negative for abdominal pain.  Musculoskeletal: Negative for gait problem.  Skin: Positive for color change.  Neurological: Negative for weakness.  Hematological: Negative for adenopathy.  Psychiatric/Behavioral: Negative for agitation.     Physical Exam Triage Vital Signs ED Triage Vitals  Enc Vitals Group     BP 11/23/18 1805 140/76     Pulse Rate 11/23/18 1805 61     Resp 11/23/18 1805 18     Temp 11/23/18 1805 98 F (36.7 C)     Temp src --      SpO2 11/23/18 1805 100 %     Weight 11/23/18 1804 150 lb (68 kg)     Height --      Head Circumference --      Peak Flow --      Pain Score --      Pain Loc --      Pain Edu? --      Excl. in [place]? --    No data found.  Updated Vital Signs BP 140/76 (BP Location: Right Arm)   Pulse 61   Temp 98 F (36.7 C)   Resp 18   Wt 68 kg   SpO2 100%   BMI 26.15 kg/m  Visual Acuity Right Eye Distance:   Left Eye Distance:   Bilateral Distance:    Right Eye Near:   Left Eye Near:    Bilateral Near:     Physical Exam Gen: NAD, alert, cooperative with exam, well-appearing ENT: normal lips, normal nasal mucosa,  Eye: normal EOM, normal conjunctiva and lids CV:  no edema, +2 pedal pulses   Resp: no accessory muscle use, non-labored,  Skin: no rashes, no areas of induration  Neuro: normal tone, normal sensation to touch Psych:  normal insight, alert and oriented MSK:  RIght hand:  Ecchymosis along the flexor surface of the fifth digit. Pinky finger with normal flexion and extension. Mild tenderness to palpation over the PIP volar surface. Neurovascular intact  UC Treatments / Results  Labs (all labs ordered are listed, but only abnormal results are displayed) Labs Reviewed - No data to display  EKG None  Radiology No results found.  Procedures Procedures (including critical care  time)  Medications Ordered in UC Medications - No data to display  Initial Impression / Assessment and Plan / UC Course  I have reviewed the triage vital signs and the nursing notes.  Pertinent labs & imaging results that were available during my care of the patient were reviewed by me and considered in my medical decision making (see chart for details).     Kristy Morse is a 57 year old female is presenting with right volar pinky finger ecchymosis and pain.  She had a popping sensation when she was opening her ironing board.  Has no loss of function or malrotation.  No significant pain currently.  Possible that she could have sprained the capsule.  Counseled on supportive care.  Counseled on home exercise therapy.  If no improvement can consider imaging or physical therapy.  Final Clinical Impressions(s) / UC Diagnoses   Final diagnoses:  Pain of finger of right hand     Discharge Instructions     Please try to ice your finger  Please perform range of motion exercises  Please follow up in 1-2 weeks if your symptoms are not improved.     ED Prescriptions    None     Controlled Substance Prescriptions [place] Controlled Substance Registry consulted? Not Applicable   Rosemarie Ax, MD 11/23/18 2146

## 2018-12-03 DIAGNOSIS — M79674 Pain in right toe(s): Secondary | ICD-10-CM | POA: Diagnosis not present

## 2018-12-03 DIAGNOSIS — L600 Ingrowing nail: Secondary | ICD-10-CM | POA: Diagnosis not present

## 2018-12-09 DIAGNOSIS — Z79899 Other long term (current) drug therapy: Secondary | ICD-10-CM | POA: Diagnosis not present

## 2018-12-09 DIAGNOSIS — H04123 Dry eye syndrome of bilateral lacrimal glands: Secondary | ICD-10-CM | POA: Diagnosis not present

## 2018-12-09 DIAGNOSIS — H43811 Vitreous degeneration, right eye: Secondary | ICD-10-CM | POA: Diagnosis not present

## 2018-12-14 ENCOUNTER — Ambulatory Visit: Payer: 59

## 2018-12-21 ENCOUNTER — Ambulatory Visit
Admission: RE | Admit: 2018-12-21 | Discharge: 2018-12-21 | Disposition: A | Discharge status: Home / Self Care | Payer: 59 | Source: Ambulatory Visit | Attending: Family Medicine | Admitting: Family Medicine

## 2018-12-21 DIAGNOSIS — Z1231 Encounter for screening mammogram for malignant neoplasm of breast: Secondary | ICD-10-CM | POA: Diagnosis not present

## 2018-12-25 DIAGNOSIS — E559 Vitamin D deficiency, unspecified: Secondary | ICD-10-CM | POA: Diagnosis not present

## 2018-12-25 DIAGNOSIS — Z79899 Other long term (current) drug therapy: Secondary | ICD-10-CM | POA: Diagnosis not present

## 2018-12-25 DIAGNOSIS — D72819 Decreased white blood cell count, unspecified: Secondary | ICD-10-CM | POA: Diagnosis not present

## 2018-12-25 DIAGNOSIS — D860 Sarcoidosis of lung: Secondary | ICD-10-CM | POA: Diagnosis not present

## 2018-12-25 DIAGNOSIS — L600 Ingrowing nail: Secondary | ICD-10-CM | POA: Diagnosis not present

## 2019-01-06 ENCOUNTER — Other Ambulatory Visit: Payer: Self-pay | Admitting: Family Medicine

## 2019-01-06 DIAGNOSIS — Z1231 Encounter for screening mammogram for malignant neoplasm of breast: Secondary | ICD-10-CM

## 2019-01-18 DIAGNOSIS — M79675 Pain in left toe(s): Secondary | ICD-10-CM | POA: Diagnosis not present

## 2019-01-18 DIAGNOSIS — L600 Ingrowing nail: Secondary | ICD-10-CM | POA: Diagnosis not present

## 2019-01-18 DIAGNOSIS — M79674 Pain in right toe(s): Secondary | ICD-10-CM | POA: Diagnosis not present

## 2019-01-18 DIAGNOSIS — B351 Tinea unguium: Secondary | ICD-10-CM | POA: Diagnosis not present

## 2019-01-30 ENCOUNTER — Emergency Department (HOSPITAL_COMMUNITY)
Admission: EM | Admit: 2019-01-30 | Discharge: 2019-01-30 | Disposition: A | Discharge status: Home / Self Care | Payer: 59 | Attending: Emergency Medicine | Admitting: Emergency Medicine

## 2019-01-30 ENCOUNTER — Encounter (HOSPITAL_COMMUNITY): Payer: Self-pay | Admitting: *Deleted

## 2019-01-30 ENCOUNTER — Other Ambulatory Visit: Payer: Self-pay

## 2019-01-30 DIAGNOSIS — R1013 Epigastric pain: Secondary | ICD-10-CM

## 2019-01-30 DIAGNOSIS — Z79899 Other long term (current) drug therapy: Secondary | ICD-10-CM | POA: Diagnosis not present

## 2019-01-30 LAB — COMPREHENSIVE METABOLIC PANEL
ALT: 20 U/L (ref 0–44)
AST: 26 U/L (ref 15–41)
Albumin: 4.6 g/dL (ref 3.5–5.0)
Alkaline Phosphatase: 52 U/L (ref 38–126)
Anion gap: 9 (ref 5–15)
BUN: 14 mg/dL (ref 6–20)
CO2: 25 mmol/L (ref 22–32)
Calcium: 9.6 mg/dL (ref 8.9–10.3)
Chloride: 105 mmol/L (ref 98–111)
Creatinine, Ser: 0.92 mg/dL (ref 0.44–1.00)
GFR calc Af Amer: >60 mL/min (ref >60)
GFR calc non Af Amer: >60 mL/min (ref >60)
Glucose, Bld: 90 mg/dL (ref 70–99)
Potassium: 3.8 mmol/L (ref 3.5–5.1)
Sodium: 139 mmol/L (ref 135–145)
Total Bilirubin: 1 mg/dL (ref 0.3–1.2)
Total Protein: 8.1 g/dL (ref 6.5–8.1)

## 2019-01-30 LAB — CBC
HCT: 40.6 % (ref 36.0–46.0)
Hemoglobin: 13.3 g/dL (ref 12.0–15.0)
MCH: 31.3 pg (ref 26.0–34.0)
MCHC: 32.8 g/dL (ref 30.0–36.0)
MCV: 95.5 fL (ref 80.0–100.0)
Platelets: 230 10*3/uL (ref 150–400)
RBC: 4.25 MIL/uL (ref 3.87–5.11)
RDW: 12.3 % (ref 11.5–15.5)
WBC: 4.5 10*3/uL (ref 4.0–10.5)
nRBC: 0 % (ref 0.0–0.2)

## 2019-01-30 LAB — URINALYSIS, ROUTINE W REFLEX MICROSCOPIC
Bilirubin Urine: NEGATIVE
Glucose, UA: NEGATIVE mg/dL
Hgb urine dipstick: NEGATIVE
Ketones, ur: NEGATIVE mg/dL
Leukocytes, UA: NEGATIVE
Nitrite: NEGATIVE
Protein, ur: NEGATIVE mg/dL
Specific Gravity, Urine: 1.005 (ref 1.005–1.030)
pH: 6 (ref 5.0–8.0)

## 2019-01-30 LAB — LIPASE, BLOOD: Lipase: 55 U/L — ABNORMAL HIGH (ref 11–51)

## 2019-01-30 MED ORDER — FAMOTIDINE 20 MG PO TABS: 20.0000 mg | ORAL_TABLET | Freq: Two times a day (BID) | ORAL | 0 refills | Status: DC

## 2019-01-30 MED ORDER — HYDROCODONE-ACETAMINOPHEN 5-325 MG PO TABS: 1.0000 | ORAL_TABLET | Freq: Four times a day (QID) | ORAL | 0 refills | Status: DC | PRN

## 2019-01-30 MED ORDER — SODIUM CHLORIDE 0.9% FLUSH: 3.0000 mL | Freq: Once | INTRAVENOUS | Status: DC

## 2019-01-30 NOTE — ED Provider Notes (Signed)
[facility] DEPT Provider Note   CSN: [id number] Arrival date & time: 01/30/19  9242     History   Chief Complaint Chief Complaint  Patient presents with  . Abdominal Pain    HPI Kristy Morse is a 58 y.o. female.  HPI Patient reports she started getting abdominal pain Thursday evening, 1 day ago.  She reports it was a aching quality and indicates her epigastrium and generally upper abdomen.  It was gone by the next morning.  She thought maybe it was just gas.  There were no associated symptoms.  Patient had eaten a salad with dressing that that evening before onset of pain.  Saturday evening the pain came back again.  Again it occurred after dinner.  Patient reports she had eaten fried fish.  She presented to the emergency department for evaluation.  She reports that the pain has again resolved.  She has not had any associated fever, vomiting, urinary symptoms, diarrhea, constipation.  Review of systems negative.  Patient did start Lamisil on Thursday for toenail onychomycosis.  This is the only new medication. History reviewed. No pertinent past medical history.  Patient Active Problem List   Diagnosis Date Noted  . SOB (shortness of breath) 08/07/2018  . Sarcoidosis 05/27/2017    Past Surgical History:  Procedure Laterality Date  . ABDOMINAL HYSTERECTOMY       OB History   No obstetric history on file.      Home Medications    Prior to Admission medications   Medication Sig Start Date End Date Taking? Authorizing Provider  famotidine (PEPCID) 20 MG tablet Take 1 tablet (20 mg total) by mouth 2 (two) times daily. 01/30/19   Charlesetta Shanks, MD  fluticasone (FLONASE) 50 MCG/ACT nasal spray Place 2 sprays into both nostrils daily. 08/25/18   [provider]  HYDROcodone-acetaminophen (NORCO/VICODIN) 5-325 MG tablet Take 1 tablet by mouth every 6 (six) hours as needed for moderate pain. 01/30/19   Charlesetta Shanks, MD  hydroxychloroquine  (PLAQUENIL) 200 MG tablet Take 200 mg by mouth daily.    [provider]  ibandronate (BONIVA) 150 MG tablet Take 150 mg by mouth every 30 (thirty) days. Take in the morning with a full glass of water, on an empty stomach, and do not take anything else by mouth or lie down for the next 30 min.    [provider]    Family History Family History  Problem Relation Age of Onset  . Colon cancer Father   . Colon cancer Sister   . Colon cancer Brother     Social History Social History   Tobacco Use  . Smoking status: Never Smoker  . Smokeless tobacco: Never Used  Substance Use Topics  . Alcohol use: No  . Drug use: No     Allergies   Doxycycline; Metronidazole; Septra [sulfamethoxazole-trimethoprim]; and Sulfa antibiotics   Review of Systems Review of Systems 10 Systems reviewed and are negative for acute change except as noted in the HPI.   Physical Exam Updated Vital Signs BP 134/75 (BP Location: Right Arm)   Pulse (!) 59   Temp 98.4 F (36.9 C) (Oral)   Resp 14   Ht 5\' 5"  (1.651 m)   Wt 68 kg   SpO2 100%   BMI 24.96 kg/m   Physical Exam Constitutional:      Appearance: She is well-developed.  HENT:     Head: Normocephalic and atraumatic.  Eyes:     Extraocular  Movements: Extraocular movements intact.     Conjunctiva/sclera: Conjunctivae normal.  Neck:     Musculoskeletal: Neck supple.  Cardiovascular:     Rate and Rhythm: Normal rate and regular rhythm.     Heart sounds: Normal heart sounds.  Pulmonary:     Effort: Pulmonary effort is normal.     Breath sounds: Normal breath sounds.  Abdominal:     General: Bowel sounds are normal. There is no distension.     Palpations: Abdomen is soft.     Tenderness: There is no abdominal tenderness. There is no guarding.  Musculoskeletal: Normal range of motion.  Skin:    General: Skin is warm and dry.  Neurological:     Mental Status: She is alert and oriented to person, place, and time.      GCS: GCS eye subscore is 4. GCS verbal subscore is 5. GCS motor subscore is 6.     Coordination: Coordination normal.      ED Treatments / Results  Labs (all labs ordered are listed, but only abnormal results are displayed) Labs Reviewed  LIPASE, BLOOD - Abnormal; Notable for the following components:      Result Value   Lipase 55 (*)    All other components within normal limits  URINALYSIS, ROUTINE W REFLEX MICROSCOPIC - Abnormal; Notable for the following components:   Color, Urine STRAW (*)    All other components within normal limits  COMPREHENSIVE METABOLIC PANEL  CBC    EKG None  Radiology No results found.  Procedures Procedures (including critical care time)  Medications Ordered in ED Medications  sodium chloride flush (NS) 0.9 % injection 3 mL (has no administration in time range)     Initial Impression / Assessment and Plan / ED Course  I have reviewed the triage vital signs and the nursing notes.  Pertinent labs & imaging results that were available during my care of the patient were reviewed by me and considered in my medical decision making (see chart for details).    Patient has had 2 episodes of aching epigastric pain after eating.  It has now resolved.  Abdominal examination is nontender.  LFTs are normal.  White count normal.  Lipase has minimal elevation.  Low suspicion for acute pancreatitis.  Possible biliary colic.  Possible gastritis.  Possible new medication gastritis.  Patient stable for continued outpatient management.  She is counseled she needs a recheck on her lipase and LFTs by her doctor at the beginning of the week.  Counseled to follow a diet for biliary colic.  Advised to take Pepcid twice daily for possible gastritis.  Return precautions reviewed.  Final Clinical Impressions(s) / ED Diagnoses   Final diagnoses:  Epigastric pain    ED Discharge Orders         Ordered    famotidine (PEPCID) 20 MG tablet  2 times daily     01/30/19  0752    HYDROcodone-acetaminophen (NORCO/VICODIN) 5-325 MG tablet  Every 6 hours PRN     01/30/19 4765           Charlesetta Shanks, MD 01/30/19 [phone]

## 2019-01-30 NOTE — ED Triage Notes (Signed)
Pt c/o epigastric pain that began on Thursday.  Pt denies n/v.  C/o pain with palpation.

## 2019-01-30 NOTE — Discharge Instructions (Addendum)
1.  Follow the gallbladder diet as instructed. 2.  Follow-up with your family doctor this week.  Your lipase is 55 today.  This is just above the normal level.  You will need a recheck in the next 2 to 5 days. 3.  Take Pepcid twice a day for the next 1 to 2 weeks to see if your symptoms resolve. 4.  Return to the emergency department if you have increasing or persisting pain, fever, vomiting or any other concerning symptoms. 5.  If your pain returns or you are consistently getting pain episodes after eating, you will need an ultrasound to evaluate your gallbladder.

## 2019-01-30 NOTE — ED Notes (Signed)
Urine culture sent to the lab. 

## 2019-02-05 ENCOUNTER — Other Ambulatory Visit: Payer: Self-pay | Admitting: Family Medicine

## 2019-02-05 DIAGNOSIS — R1013 Epigastric pain: Secondary | ICD-10-CM

## 2019-02-10 DIAGNOSIS — H40013 Open angle with borderline findings, low risk, bilateral: Secondary | ICD-10-CM | POA: Diagnosis not present

## 2019-02-10 DIAGNOSIS — Z79899 Other long term (current) drug therapy: Secondary | ICD-10-CM | POA: Diagnosis not present

## 2019-02-10 DIAGNOSIS — H04123 Dry eye syndrome of bilateral lacrimal glands: Secondary | ICD-10-CM | POA: Diagnosis not present

## 2019-02-11 ENCOUNTER — Other Ambulatory Visit: Payer: 59

## 2019-02-17 ENCOUNTER — Ambulatory Visit
Admission: RE | Admit: 2019-02-17 | Discharge: 2019-02-17 | Disposition: A | Discharge status: Home / Self Care | Payer: 59 | Source: Ambulatory Visit | Attending: Family Medicine | Admitting: Family Medicine

## 2019-02-17 DIAGNOSIS — R1013 Epigastric pain: Secondary | ICD-10-CM

## 2019-02-17 DIAGNOSIS — K8020 Calculus of gallbladder without cholecystitis without obstruction: Secondary | ICD-10-CM | POA: Diagnosis not present

## 2019-02-26 DIAGNOSIS — B351 Tinea unguium: Secondary | ICD-10-CM | POA: Diagnosis not present

## 2019-02-26 DIAGNOSIS — M1712 Unilateral primary osteoarthritis, left knee: Secondary | ICD-10-CM | POA: Diagnosis not present

## 2019-02-26 DIAGNOSIS — L600 Ingrowing nail: Secondary | ICD-10-CM | POA: Diagnosis not present

## 2019-02-26 DIAGNOSIS — M79674 Pain in right toe(s): Secondary | ICD-10-CM | POA: Diagnosis not present

## 2019-02-26 DIAGNOSIS — M79675 Pain in left toe(s): Secondary | ICD-10-CM | POA: Diagnosis not present

## 2019-02-26 DIAGNOSIS — M25562 Pain in left knee: Secondary | ICD-10-CM | POA: Diagnosis not present

## 2019-03-08 ENCOUNTER — Ambulatory Visit: Payer: Self-pay | Admitting: Surgery

## 2019-03-08 DIAGNOSIS — K851 Biliary acute pancreatitis without necrosis or infection: Secondary | ICD-10-CM | POA: Diagnosis not present

## 2019-03-08 DIAGNOSIS — K8010 Calculus of gallbladder with chronic cholecystitis without obstruction: Secondary | ICD-10-CM | POA: Diagnosis not present

## 2019-03-08 NOTE — H&P (Signed)
Kristy Morse Documented: 03/08/2019 11:30 AM Location: [facility] Patient #: [id number] DOB: 1961-03-03 Single / Language: Cleophus Molt / Race: Black or African American Female   History of Present Illness Adin Hector MD; 03/08/2019 5:47 PM) The patient is a 58 year old female who presents with non-malignant abdominal pain. Note for "Non-malignant abdominal pain": ` ` ` Patient sent for surgical consultation at the request of Dr Marisue Humble  Chief Complaint: Epigastric pain with gallstones.  ` ` The patient is a pleasant woman with chronic heartburn controlled with famotidine. He had some episodes of epigastric pain. Always triggered by eating. Usually heavier foods. She had many flares for several weeks that concerned her. Had a very intense attack. [facility]. Gallstones noted but no cholecystitis. At seem consistent with GERD. Follow-up with primary care physician. Biliary colic suspected. Surgical consultation requested. She has a history of sarcoid disease but is not active and is controlled with plaque Little and Flonase. Not on steroids. Not on oxygen. Good exercise tolerance. Was seen by Dr. Melvyn Novas with pulmonary but has not needed to see her in over a year, last visit was PRN rec -follow-up when necessary.   She moves her bowels about every other day. She is a strong family history of colon cancer with siblings. Gets colonoscopies every 5 years. Nothing recently. Followed by Dr. Penelope Coop. [facility] as well. She does not smoke. She had a tubal ligation but no other abdominal surgeries. No personal nor family history of inflammatory bowel disease, irritable bowel syndrome, allergy such as Celiac Sprue, dietary/dairy problems, colitis, ulcers nor gastritis. No recent sick contacts/gastroenteritis. No travel outside the country. No changes in diet. No dysphagia to solids or liquids. No hematochezia, hematemesis, coffee ground emesis. No evidence  of prior gastric/peptic ulceration.  (Review of systems as stated in this history (HPI) or in the review of systems. Otherwise all other 12 point ROS are negative) ` ` `   Past Surgical History Nance Pew, CMA; 03/08/2019 11:30 AM) Foot Surgery  Bilateral. Hysterectomy (not due to cancer) - Partial   Diagnostic Studies History Nance Pew, CMA; 03/08/2019 11:30 AM) Colonoscopy  within last year Mammogram  1-3 years ago  Allergies Nance Pew, CMA; 03/08/2019 11:31 AM) No Known Drug Allergies [03/08/2019]: Allergies Reconciled   Medication History Nance Pew, CMA; 03/08/2019 11:31 AM) HYDROcodone-Acetaminophen (5-325MG  Tablet, Oral) Active. Ciclopirox (1% Shampoo, External) Active. Famotidine (20MG  Tablet, Oral) Active. Fluticasone Propionate (50MCG/ACT Suspension, Nasal) Active. Hydroxychloroquine Sulfate (200MG  Tablet, Oral) Active. Ibandronate Sodium (150MG  Tablet, Oral) Active. Terbinafine HCl (250MG  Tablet, Oral) Active. Estradiol (0.05MG /24HR Patch TW, Transdermal) Active. Medications Reconciled  Social History Nance Pew, CMA; 03/08/2019 11:30 AM) Alcohol use  Remotely quit alcohol use. Caffeine use  Coffee. No drug use  Tobacco use  Never smoker.  Family History Nance Pew, [place]; 03/08/2019 11:30 AM) Arthritis  Sister. Cancer  Father. Cerebrovascular Accident  Mother. Colon Cancer  Brother, Sister. Diabetes Mellitus  Brother, Sister. Hypertension  Mother. Prostate Cancer  Family Members In General.  Pregnancy / Birth History Nance Pew, [place]; 03/08/2019 11:30 AM) Age at menarche  70 years. Gravida  2 Maternal age  <15 Para  1  Other Problems Nance Pew, CMA; 03/08/2019 11:30 AM) Gastroesophageal Reflux Disease     Review of Systems (Sabrina Canty CMA; 03/08/2019 11:30 AM) General Not Present- Appetite Loss, Chills, Fatigue, Fever, Night Sweats, Weight Gain and Weight Loss. Skin Not Present- Change in  Wart/Mole, Dryness, Hives, Jaundice, New Lesions, Non-Healing Wounds, Rash and  Ulcer. HEENT Not Present- Earache, Hearing Loss, Hoarseness, Nose Bleed, Oral Ulcers, Ringing in the Ears, Seasonal Allergies, Sinus Pain, Sore Throat, Visual Disturbances, Wears glasses/contact lenses and Yellow Eyes. Respiratory Not Present- Bloody sputum, Chronic Cough, Difficulty Breathing, Snoring and Wheezing. Breast Not Present- Breast Mass, Breast Pain, Nipple Discharge and Skin Changes. Cardiovascular Not Present- Chest Pain, Difficulty Breathing Lying Down, Leg Cramps, Palpitations, Rapid Heart Rate, Shortness of Breath and Swelling of Extremities. Gastrointestinal Not Present- Abdominal Pain, Bloating, Bloody Stool, Change in Bowel Habits, Chronic diarrhea, Constipation, Difficulty Swallowing, Excessive gas, Gets full quickly at meals, Hemorrhoids, Indigestion, Nausea, Rectal Pain and Vomiting. Female Genitourinary Not Present- Frequency, Nocturia, Painful Urination, Pelvic Pain and Urgency. Musculoskeletal Not Present- Back Pain, Joint Pain, Joint Stiffness, Muscle Pain, Muscle Weakness and Swelling of Extremities. Neurological Not Present- Decreased Memory, Fainting, Headaches, Numbness, Seizures, Tingling, Tremor, Trouble walking and Weakness. Psychiatric Not Present- Anxiety, Bipolar, Change in Sleep Pattern, Depression, Fearful and Frequent crying. Endocrine Not Present- Cold Intolerance, Excessive Hunger, Hair Changes, Heat Intolerance, Hot flashes and New Diabetes. Hematology Not Present- Blood Thinners, Easy Bruising, Excessive bleeding, Gland problems, HIV and Persistent Infections.  Vitals (Sabrina Canty CMA; 03/08/2019 11:32 AM) 03/08/2019 11:32 AM Weight: 156.8 lb Height: 64in Body Surface Area: 1.76 m Body Mass Index: 26.91 kg/m  Temp.: 97.23F(Temporal)  Pulse: 97 (Regular)  P.OX: 98% (Room air) BP: 140/82 (Sitting, Left Arm, Standard)       Physical Exam Adin Hector MD;  03/08/2019 11:54 AM) General Mental Status-Alert. General Appearance-Not in acute distress, Not Sickly. Orientation-Oriented X3. Hydration-Well hydrated. Voice-Normal.  Integumentary Global Assessment Upon inspection and palpation of skin surfaces of the - Axillae: non-tender, no inflammation or ulceration, no drainage. and Distribution of scalp and body hair is normal. General Characteristics Temperature - normal warmth is noted.  Head and Neck Head-normocephalic, atraumatic with no lesions or palpable masses. Face Global Assessment - atraumatic, no absence of expression. Neck Global Assessment - no abnormal movements, no bruit auscultated on the right, no bruit auscultated on the left, no decreased range of motion, non-tender. Trachea-midline. Thyroid Gland Characteristics - non-tender.  Eye Eyeball - Left-Extraocular movements intact, No Nystagmus. Eyeball - Right-Extraocular movements intact, No Nystagmus. Cornea - Left-No Hazy. Cornea - Right-No Hazy. Sclera/Conjunctiva - Left-No scleral icterus, No Discharge. Sclera/Conjunctiva - Right-No scleral icterus, No Discharge. Pupil - Left-Direct reaction to light normal. Pupil - Right-Direct reaction to light normal.  ENMT Ears Pinna - Left - no drainage observed, no generalized tenderness observed. Right - no drainage observed, no generalized tenderness observed. Nose and Sinuses External Inspection of the Nose - no destructive lesion observed. Inspection of the nares - Left - quiet respiration. Right - quiet respiration. Mouth and Throat Lips - Upper Lip - no fissures observed, no pallor noted. Lower Lip - no fissures observed, no pallor noted. Nasopharynx - no discharge present. Oral Cavity/Oropharynx - Tongue - no dryness observed. Oral Mucosa - no cyanosis observed. Hypopharynx - no evidence of airway distress observed.  Chest and Lung Exam Inspection Movements - Normal and Symmetrical.  Accessory muscles - No use of accessory muscles in breathing. Palpation Palpation of the chest reveals - Non-tender. Auscultation Breath sounds - Normal and Clear.  Cardiovascular Auscultation Rhythm - Regular. Murmurs & Other Heart Sounds - Auscultation of the heart reveals - No Murmurs and No Systolic Clicks.  Abdomen Inspection Inspection of the abdomen reveals - No Visible peristalsis and No Abnormal pulsations. Umbilicus - No Bleeding, No Urine drainage. Palpation/Percussion Palpation and  Percussion of the abdomen reveal - Soft, Non Tender, No Rebound tenderness, No Rigidity (guarding) and No Cutaneous hyperesthesia. Note: Abdomen soft. Mild epigastric discomfort but no guarding. No Murphy sign. Not distended. No distasis recti. No umbilical or other anterior abdominal wall hernias   Female Genitourinary Sexual Maturity Tanner 5 - Adult hair pattern. Note: No vaginal bleeding nor discharge   Peripheral Vascular Upper Extremity Inspection - Left - No Cyanotic nailbeds, Not Ischemic. Right - No Cyanotic nailbeds, Not Ischemic.  Neurologic Neurologic evaluation reveals -normal attention span and ability to concentrate, able to name objects and repeat phrases. Appropriate fund of knowledge , normal sensation and normal coordination. Mental Status Affect - not angry, not paranoid. Cranial Nerves-Normal Bilaterally. Gait-Normal.  Neuropsychiatric Mental status exam performed with findings of-able to articulate well with normal speech/language, rate, volume and coherence, thought content normal with ability to perform basic computations and apply abstract reasoning and no evidence of hallucinations, delusions, obsessions or homicidal/suicidal ideation.  Musculoskeletal Global Assessment Spine, Ribs and Pelvis - no instability, subluxation or laxity. Right Upper Extremity - no instability, subluxation or laxity.  Lymphatic Head & Neck  General Head & Neck  Lymphatics: Bilateral - Description - No Localized lymphadenopathy. Axillary  General Axillary Region: Bilateral - Description - No Localized lymphadenopathy. Femoral & Inguinal  Generalized Femoral & Inguinal Lymphatics: Left - Description - No Localized lymphadenopathy. Right - Description - No Localized lymphadenopathy.    Assessment & Plan Adin Hector MD; 03/08/2019 11:53 AM) CHRONIC CHOLECYSTITIS WITH CALCULUS (K80.10) Impression: Rather classic story of biliary colic postprandial and not consistent with her heartburn or reflux. Cholelithiasis.  I think she would benefit from cholecystectomy. Reasonable single site minimally invasive approach.  She is not had any attacks more recently he would like to wait until May possible. I think it is reasonable to see if we can wait that long. However, there is risk she can have an attack that may require more urgent cholecystectomy.  She does have sarcoid disease but she seems quite well-controlled with normal function on plaque: Flonase. Not needing steroids. Not oxygen dependent. Not active disease. Last seen by Dr. Lottie Dawson in 2019 with when necessary follow-up since she was very low risk. Current Plans You are being scheduled for surgery- Our schedulers will call you.  You should hear from our office's scheduling department within 5 working days about the location, date, and time of surgery. We try to make accommodations for patient's preferences in scheduling surgery, but sometimes the OR schedule or the surgeon's schedule prevents [place] from making those accommodations.  If you have not heard from our office [phone]) in 5 working days, call the office and ask for your surgeon's nurse.  If you have other questions about your diagnosis, plan, or surgery, call the office and ask for your surgeon's nurse.  Written instructions provided Pt Education - Pamphlet Given - Laparoscopic Gallbladder Surgery: discussed with patient and provided  information. The anatomy & physiology of hepatobiliary & pancreatic function was discussed. The pathophysiology of gallbladder dysfunction was discussed. Natural history risks without surgery was discussed. I feel the risks of no intervention will lead to serious problems that outweigh the operative risks; therefore, I recommended cholecystectomy to remove the pathology. I explained laparoscopic techniques with possible need for an open approach. Probable cholangiogram to evaluate the bilary tract was explained as well.  Risks such as bleeding, infection, abscess, leak, injury to other organs, need for further treatment, heart attack, death, and other risks were  discussed. I noted a good likelihood this will help address the problem. Possibility that this will not correct all abdominal symptoms was explained. Goals of post-operative recovery were discussed as well. We will work to minimize complications. An educational handout further explaining the pathology and treatment options was given as well. Questions were answered. The patient expresses understanding & wishes to proceed with surgery.  Pt Education - CCS Laparosopic Post Op HCI (Tiawanna Luchsinger) Pt Education - CCS Good Bowel Health (Cloe Sockwell) Pt Education - Laparoscopic Cholecystectomy: gallbladder GALL STONE PANCREATITIS (K85.10) Impression: Probable episode of mild gallstone pancreatitis as evidenced by borderline elevated amylase and lipase and epigastric symptoms. Quickly self resolved. No evidence of any common bile duct stone or other abnormality.  Should hopefully break the cycle of attacks with cholecystectomy.     Adin Hector, MD, FACS, MASCRS Gastrointestinal and Minimally Invasive Surgery    [address] [place], [place] [address] [phone] Main / Paging [phone] Fax

## 2019-04-12 DIAGNOSIS — M79674 Pain in right toe(s): Secondary | ICD-10-CM | POA: Diagnosis not present

## 2019-04-12 DIAGNOSIS — B351 Tinea unguium: Secondary | ICD-10-CM | POA: Diagnosis not present

## 2019-04-12 DIAGNOSIS — M79675 Pain in left toe(s): Secondary | ICD-10-CM | POA: Diagnosis not present

## 2019-04-12 DIAGNOSIS — L600 Ingrowing nail: Secondary | ICD-10-CM | POA: Diagnosis not present

## 2019-04-15 DIAGNOSIS — L219 Seborrheic dermatitis, unspecified: Secondary | ICD-10-CM | POA: Diagnosis not present

## 2019-05-18 DIAGNOSIS — B351 Tinea unguium: Secondary | ICD-10-CM | POA: Diagnosis not present

## 2019-06-18 ENCOUNTER — Other Ambulatory Visit: Payer: Self-pay | Admitting: Family Medicine

## 2019-06-18 DIAGNOSIS — M8580 Other specified disorders of bone density and structure, unspecified site: Secondary | ICD-10-CM

## 2019-08-11 ENCOUNTER — Other Ambulatory Visit: Payer: 59

## 2019-08-12 ENCOUNTER — Ambulatory Visit
Admission: RE | Admit: 2019-08-12 | Discharge: 2019-08-12 | Disposition: A | Discharge status: Home / Self Care | Payer: 59 | Source: Ambulatory Visit | Attending: Family Medicine | Admitting: Family Medicine

## 2019-08-12 ENCOUNTER — Other Ambulatory Visit: Payer: Self-pay

## 2019-08-12 DIAGNOSIS — M8580 Other specified disorders of bone density and structure, unspecified site: Secondary | ICD-10-CM

## 2019-08-16 IMAGING — MG DIGITAL SCREENING BILATERAL MAMMOGRAM WITH TOMO AND CAD
8 series · 9 of 24 positions shown · non-contrast
Comparison: Previous exam(s).

CLINICAL DATA: Screening.

EXAM:
DIGITAL SCREENING BILATERAL MAMMOGRAM WITH TOMO AND CAD

[R CC synth-2D]
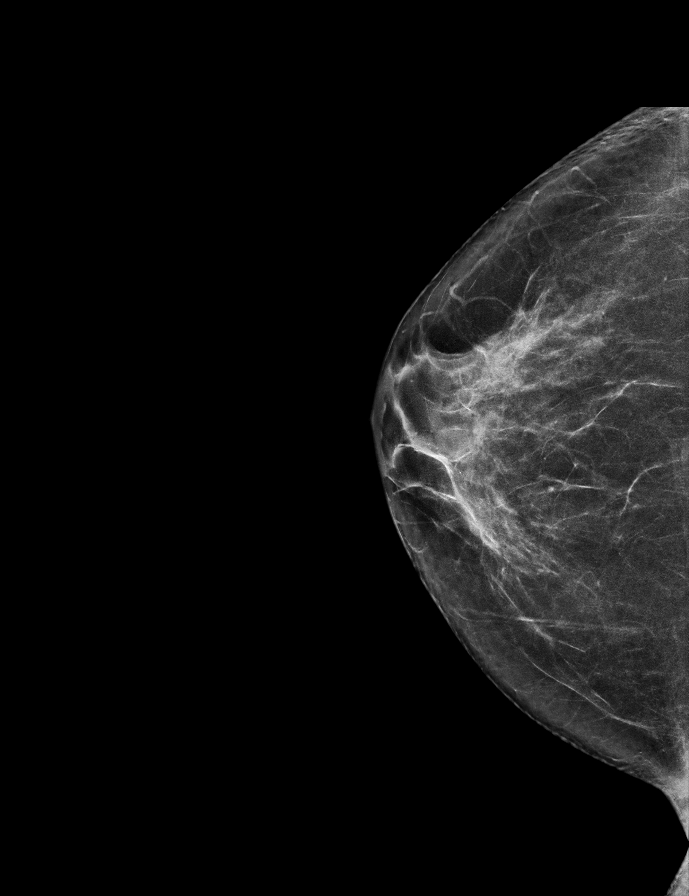

[L CC synth-2D]
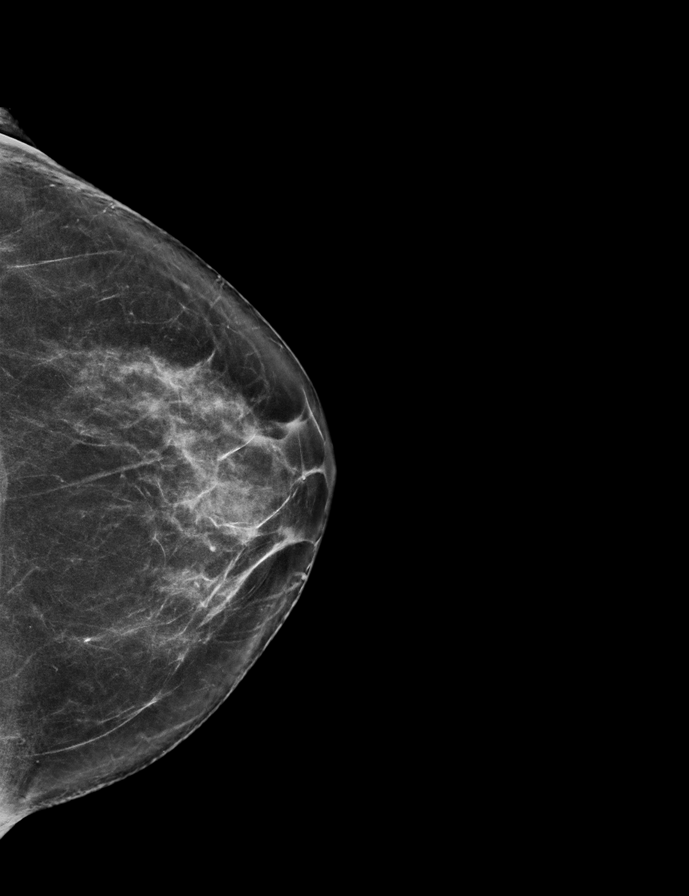

[R MLO synth-2D]
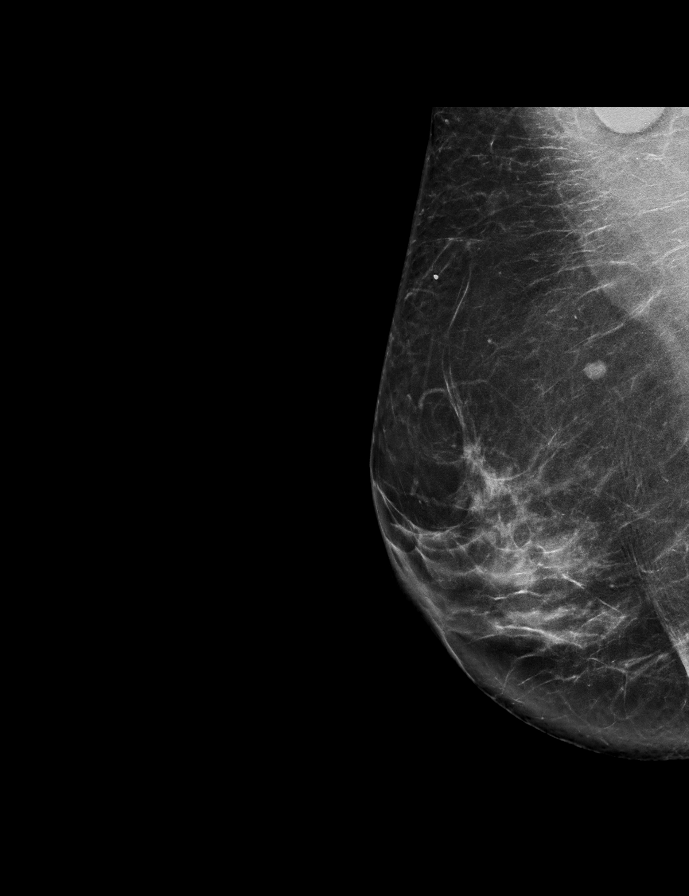

[L MLO synth-2D]
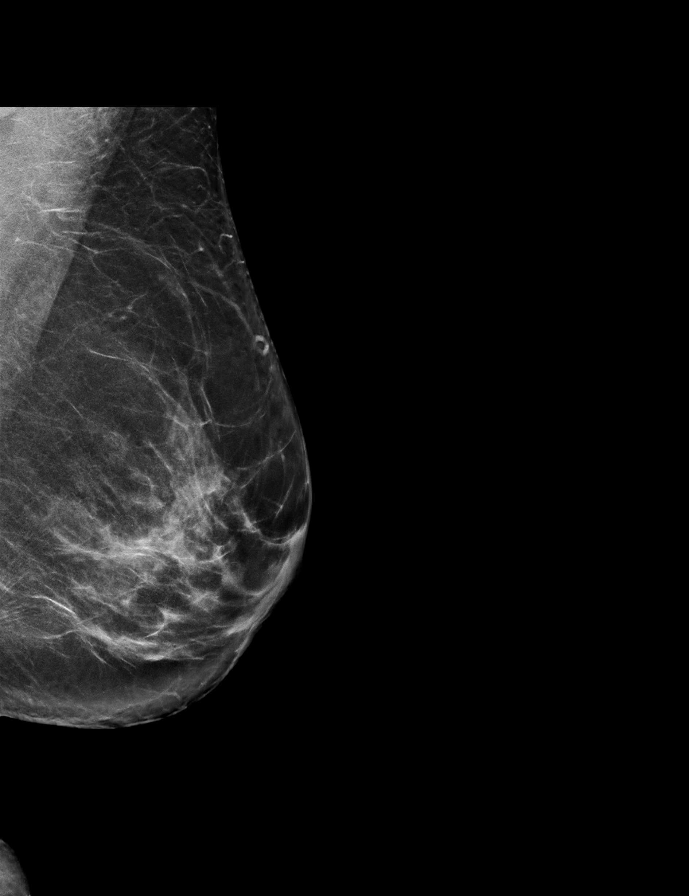

[L CC tomo · 2 of 67 frames shown]
[frame 22/67]
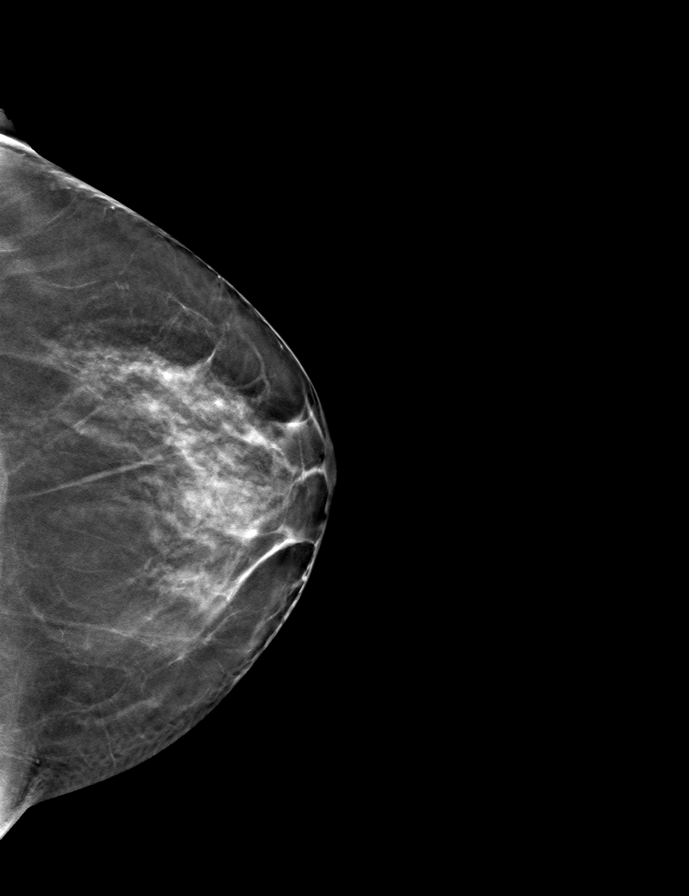
[frame 34/67]
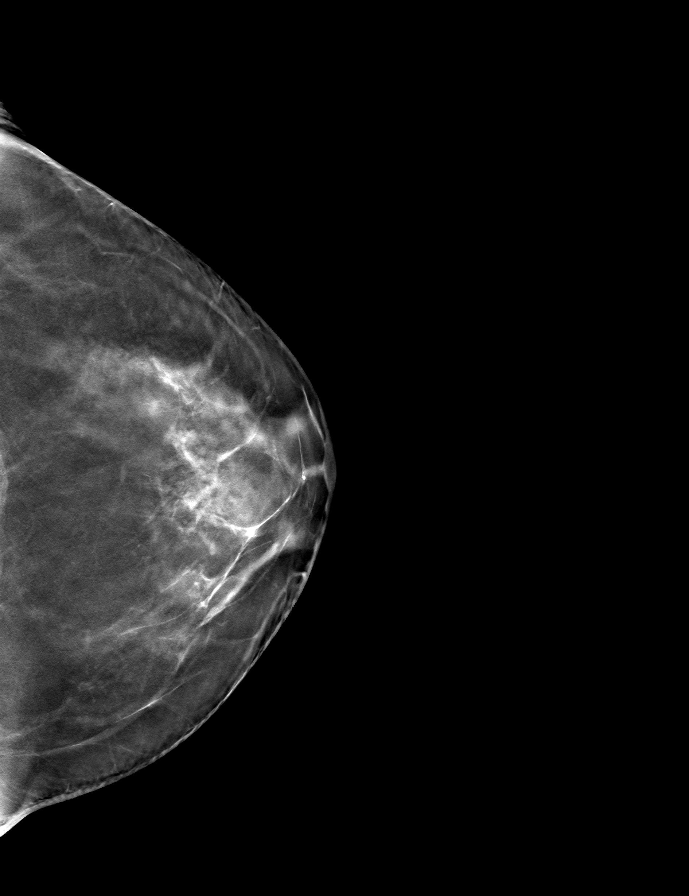

[R CC tomo · tomo slice 35/68.0]
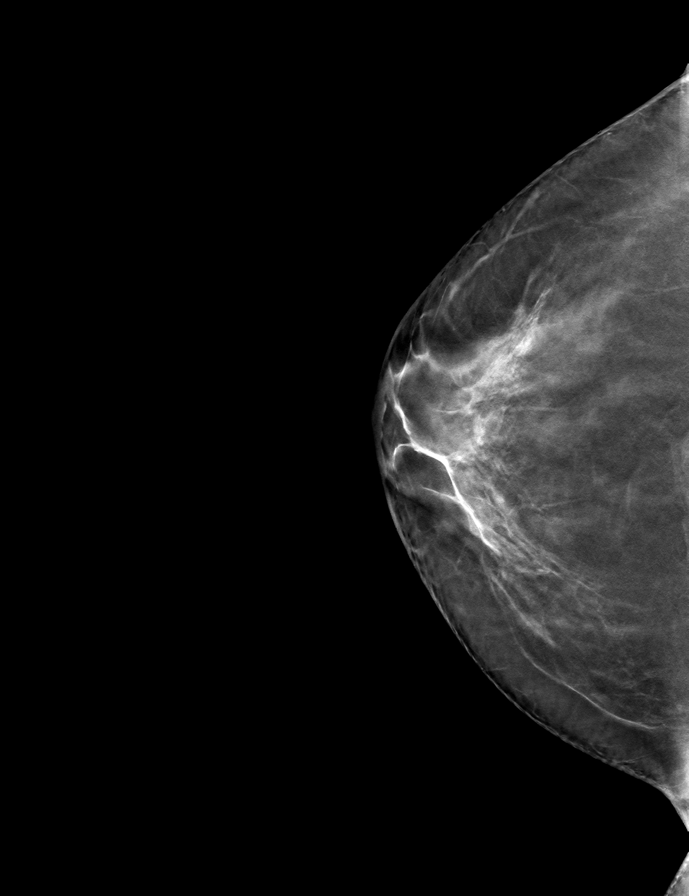

[R MLO tomo · tomo slice 40/79.0]
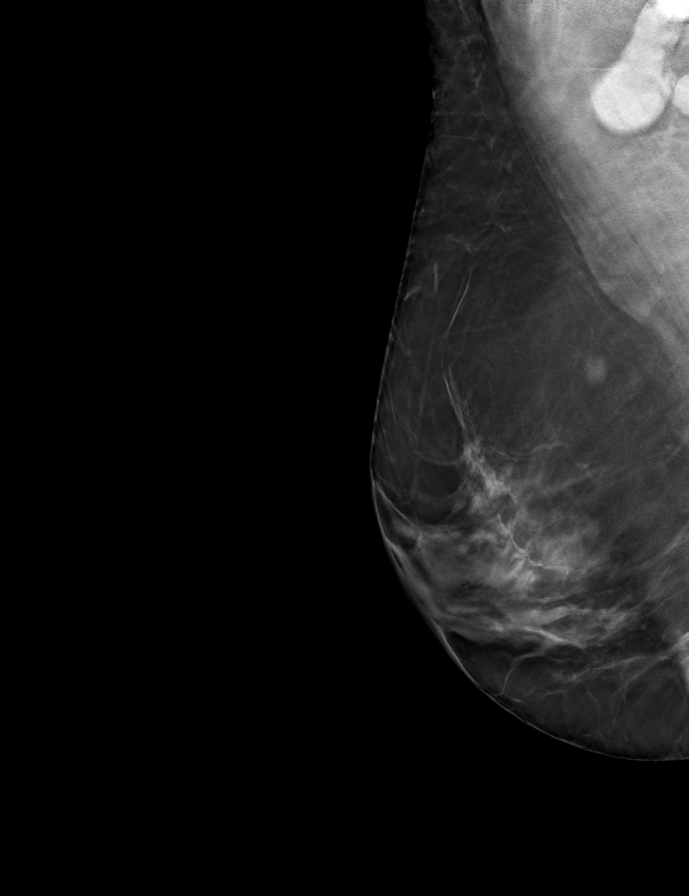

[L MLO tomo · tomo slice 37/74.0]
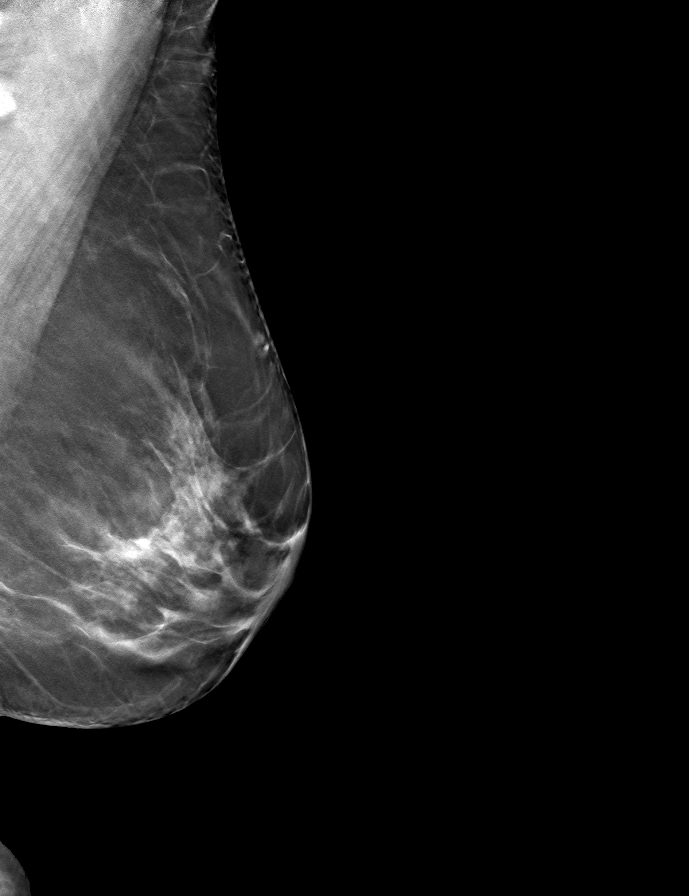

[9 of 24 positions shown; findings below may reference images not displayed]

ACR Breast Density Category c: The breast tissue is heterogeneously
dense, which may obscure small masses.
FINDINGS: There are no findings suspicious for malignancy. Images were
processed with CAD.
IMPRESSION: No mammographic evidence of malignancy. A result letter of this
screening mammogram will be mailed directly to the patient.

RECOMMENDATION:
Screening mammogram in one year. (Code:[id number])

BI-RADS CATEGORY  1: Negative.

## 2019-12-27 ENCOUNTER — Ambulatory Visit
Admission: RE | Admit: 2019-12-27 | Discharge: 2019-12-27 | Disposition: A | Discharge status: Home / Self Care | Payer: 59 | Source: Ambulatory Visit | Attending: Family Medicine | Admitting: Family Medicine

## 2019-12-27 ENCOUNTER — Other Ambulatory Visit: Payer: Self-pay

## 2019-12-27 DIAGNOSIS — Z1231 Encounter for screening mammogram for malignant neoplasm of breast: Secondary | ICD-10-CM

## 2020-05-03 ENCOUNTER — Other Ambulatory Visit: Payer: Self-pay | Admitting: Family Medicine

## 2020-05-03 DIAGNOSIS — Z1231 Encounter for screening mammogram for malignant neoplasm of breast: Secondary | ICD-10-CM

## 2020-06-12 ENCOUNTER — Telehealth: Payer: Self-pay | Admitting: Internal Medicine

## 2020-06-12 DIAGNOSIS — D869 Sarcoidosis, unspecified: Secondary | ICD-10-CM

## 2020-06-12 NOTE — Telephone Encounter (Signed)
Ok to order pfts  Dx sarcoidosis

## 2020-06-12 NOTE — Telephone Encounter (Signed)
PFT ordered as requested.  Routing back to [employer] for scheduling.  Please let us know if anything else is needed.  Thanks!

## 2020-06-12 NOTE — Telephone Encounter (Signed)
Dr Melvyn Novas please advise:  2021 03:28 PM Phone (Incoming) [place], [place] (Self) [phone] (H)   Dr. Wells Guiles from [facility] sent in a referral for a PFT w/ DLCO - does Dr. Melvyn Novas want this order? I see that she was last seen by Dr. Melvyn Novas 09.17.19 - please advise and I can get her scheduled.

## 2020-06-14 ENCOUNTER — Other Ambulatory Visit: Payer: Self-pay

## 2020-06-14 ENCOUNTER — Ambulatory Visit (INDEPENDENT_AMBULATORY_CARE_PROVIDER_SITE_OTHER): Payer: 59 | Admitting: Internal Medicine

## 2020-06-14 DIAGNOSIS — D869 Sarcoidosis, unspecified: Secondary | ICD-10-CM | POA: Diagnosis not present

## 2020-06-14 LAB — PULMONARY FUNCTION TEST
DL/VA % pred: 139 %
DL/VA: 5.9 ml/min/mmHg/L
DLCO cor % pred: 99 %
DLCO cor: 20.13 ml/min/mmHg
DLCO unc % pred: 99 %
DLCO unc: 20.13 ml/min/mmHg
FEF 25-75 Post: 1.47 L/sec
FEF 25-75 Pre: 1.62 L/sec
FEF2575-%Change-Post: -9 %
FEF2575-%Pred-Post: 69 %
FEF2575-%Pred-Pre: 76 %
FEV1-%Change-Post: -2 %
FEV1-%Pred-Post: 82 %
FEV1-%Pred-Pre: 84 %
FEV1-Post: 1.74 L
FEV1-Pre: 1.79 L
FEV1FVC-%Change-Post: 4 %
FEV1FVC-%Pred-Pre: 100 %
FEV6-%Change-Post: -6 %
FEV6-%Pred-Post: 80 %
FEV6-%Pred-Pre: 86 %
FEV6-Post: 2.08 L
FEV6-Pre: 2.24 L
FEV6FVC-%Pred-Post: 103 %
FEV6FVC-%Pred-Pre: 103 %
FVC-%Change-Post: -6 %
FVC-%Pred-Post: 77 %
FVC-%Pred-Pre: 83 %
FVC-Post: 2.08 L
FVC-Pre: 2.24 L
Post FEV1/FVC ratio: 84 %
Post FEV6/FVC ratio: 100 %
Pre FEV1/FVC ratio: 80 %
Pre FEV6/FVC Ratio: 100 %
RV % pred: 88 %
RV: 1.75 L
TLC % pred: 80 %
TLC: 4.09 L

## 2020-06-14 NOTE — Progress Notes (Signed)
Full PFT performed today. °

## 2020-06-14 NOTE — Telephone Encounter (Signed)
Pt has PFT appt scheduled today 6/16. Nothing further needed.

## 2020-06-15 ENCOUNTER — Telehealth: Payer: Self-pay | Admitting: Internal Medicine

## 2020-06-15 NOTE — Telephone Encounter (Signed)
Spoke with patient. She is aware of PFT results. I advised her that I will send a copy of MW's recommendations as well as the actual test results over to Dr. Dossie Der. She verbalized understanding.   Nothing further needed at time of call.

## 2020-12-27 ENCOUNTER — Ambulatory Visit
Admission: RE | Admit: 2020-12-27 | Discharge: 2020-12-27 | Disposition: A | Discharge status: Home / Self Care | Payer: 59 | Source: Ambulatory Visit | Attending: Family Medicine | Admitting: Family Medicine

## 2020-12-27 ENCOUNTER — Other Ambulatory Visit: Payer: Self-pay

## 2020-12-27 ENCOUNTER — Ambulatory Visit: Payer: 59

## 2020-12-27 DIAGNOSIS — Z1231 Encounter for screening mammogram for malignant neoplasm of breast: Secondary | ICD-10-CM

## 2021-01-03 ENCOUNTER — Other Ambulatory Visit: Payer: Self-pay | Admitting: Family Medicine

## 2021-01-03 DIAGNOSIS — Z1231 Encounter for screening mammogram for malignant neoplasm of breast: Secondary | ICD-10-CM

## 2021-05-14 ENCOUNTER — Telehealth: Payer: Self-pay

## 2021-05-14 NOTE — Telephone Encounter (Signed)
Progress notes in file sent from [facility], Phone: [phone]. A copy of referral have been sent to scheduling.

## 2021-05-22 ENCOUNTER — Encounter: Payer: Self-pay | Admitting: Internal Medicine

## 2021-05-22 ENCOUNTER — Ambulatory Visit (INDEPENDENT_AMBULATORY_CARE_PROVIDER_SITE_OTHER): Payer: 59 | Admitting: Internal Medicine

## 2021-05-22 ENCOUNTER — Other Ambulatory Visit: Payer: Self-pay

## 2021-05-22 DIAGNOSIS — D869 Sarcoidosis, unspecified: Secondary | ICD-10-CM

## 2021-05-22 NOTE — Progress Notes (Signed)
Subjective:     Patient ID: Kristy Morse, female   DOB: [birth year],     MRN: [id number]     Brief patient profile:  89  yobf never smoker with sinus problems since her 70's got worse >  ENT eval around age 60 found sarcoid changes > rx nasal sparys helped a lot then moved to [facility] 2008 with cc new excessive tearing of   eyes > Dr Jerline Pain eval with ocular sarcoid / tearduct obst rx plaquenil by [facility] > improved p tearduct surgery and contineud plaquenil since then > referred to [facility] 2017 > cxr and pft done and referred to pulmonary clinic 05/27/2017 by Dr   Dossie Der     History of Present Illness  05/27/2017 1st [facility] office visit/ Denetra Formoso   Chief Complaint  Patient presents with  . Pulmonary Consult    Referred by Dr. Dossie Der. 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   all of the original symptoms that triggered intial evals dating back to age 60 have resolved at this  Point on just 200 mg plaquenil and Not limited by breathing from desired activities  / no cough  rec No indication for treatment     08/06/2018   extended ov/Lamario Mani MV:HQIONGE eyes/skin  new sob at rest not reproduced with ex  Chief Complaint  Patient presents with  . Follow-up    increased SOB x 1 month- comes and goes and is mild.   feels fine walking level x 30 min x fast pace x 2x daily  /  Sob occurs at rest, better p 2 breaths/ not at hs Onset x one month prior to OV ,  comes on sev times a day, never sleeping/ no clear trigger  No cough / rash about same on plaquenil 200 mg daily  rec Try off boniva  For now and continue regular walking at a pace where you are slightly short of breath but never out of breath  Please remember to go to the  x-ray department downstairs in the basement  for your tests - we will call you with the results when they are available.  Please schedule a follow up office visit in 6 weeks, call sooner if needed with PFTs on return    09/15/2018  f/u ov/Onyekachi Gathright re: sarcoid with skin/ eye / still on  boniva, sob resolved  Chief Complaint  Patient presents with  . Follow-up    PFT's done on 09/14/18. She states her breathing has improved. No new co's.   Dyspnea:  Ex x 30 min s difficulty  Cough: no Sleeping: 2 pillows/ bed flat  SABA use: none 02: none  rec If breathing or coughing worse, try off boniva first then return to this clinic after a month off it.    05/22/2021  f/u ov/Esaw Knippel re: syed f/u on plaquenil 200 with skin/ joint/tear ducts min pulmonary involvement  Chief Complaint  Patient presents with  . Follow-up    Breathing is doing well and no new co's today.   Dyspnea:   2-3 days x 30 min each walk > run Cough: none  Sleeping: no resp complaints  SABA use: none  02: none Covid status:  vax  X 3    No obvious day to day or daytime variability or assoc excess/ purulent sputum or mucus plugs or hemoptysis or cp or chest tightness, subjective wheeze or overt sinus or hb symptoms.   sleeping without nocturnal  or early am exacerbation  of respiratory  c/o's or need for noct saba. Also denies any obvious fluctuation of symptoms with weather or environmental changes or other aggravating or alleviating factors except as outlined above   No unusual exposure hx or h/o childhood pna/ asthma or knowledge of premature birth.  Current Allergies, Complete Past Medical History, Past Surgical History, Family History, and Social History were reviewed in [employer] record.  ROS  The following are not active complaints unless bolded Hoarseness, sore throat, dysphagia, dental problems, itching, sneezing,  nasal congestion or discharge of excess mucus or purulent secretions, ear ache,   fever, chills, sweats, unintended wt loss or wt gain, classically pleuritic or exertional cp,  orthopnea pnd or arm/hand swelling  or leg swelling, presyncope, palpitations, abdominal pain, anorexia, nausea, vomiting, diarrhea  or change in bowel habits or change in bladder habits,  change in stools or change in urine, dysuria, hematuria,  rash, arthralgias, visual complaints, headache, numbness, weakness or ataxia or problems with walking or coordination,  change in mood or  memory.             Current Meds  Medication Sig  . fluticasone (FLONASE) 50 MCG/ACT nasal spray Place 2 sprays into both nostrils daily.  . hydroxychloroquine (PLAQUENIL) 200 MG tablet Take 200 mg by mouth daily.  Marland Kitchen ibandronate (BONIVA) 150 MG tablet Take 150 mg by mouth every 30 (thirty) days. Take in the morning with a full glass of water, on an empty stomach, and do not take anything else by mouth or lie down for the next 30 min.  . pantoprazole (PROTONIX) 40 MG tablet Take 40 mg by mouth 2 (two) times daily before a meal.  . valACYclovir (VALTREX) 500 MG tablet Take 500 mg by mouth 2 (two) times daily as needed.                  Objective:   Physical Exam        05/22/2021      148   09/15/2018      158   08/06/18 159 lb 12.8 oz (72.5 kg)  05/27/17 150 lb (68 kg)    Vital signs reviewed  05/22/2021  - Note at rest 02 sats  97% on RA   General appearance:    amb pleasant bf nad     HEENT : pt wearing mask not removed for exam due to covid -19 concerns.    NECK :  without JVD/Nodes/TM/ nl carotid upstrokes bilaterally   LUNGS: no acc muscle use,  Nl contour chest which is clear to A and P bilaterally without cough on insp or exp maneuvers   CV:  RRR  no s3 or murmur or increase in P2, and no edema   ABD:  soft and nontender with nl inspiratory excursion in the supine position. No bruits or organomegaly appreciated, bowel sounds nl  MS:  Nl gait/ ext warm without deformities, calf tenderness, cyanosis or clubbing No obvious joint restrictions   SKIN: warm and dry   No changes : classic purplish flesy plaques L elbow, R knee, neck posteriorly at hariline, and 2x1 cm R neck just below mandible      NEURO:  alert, approp, nl sensorium with  no motor or cerebellar deficits  apparent.         I personally reviewed images and agree with radiology impression as follows:  CXR:   Pa and Lateral  04/13/21 1. No acute cardiopulmonary disease. 2. A 5 mm right upper lobe  pulmonary nodule projecting over the right anterior second rib. Recommend follow-up chest x-ray in 6 months.                     Assessment:

## 2021-05-22 NOTE — Assessment & Plan Note (Signed)
Onset 2000 predominantly sinus / tear duct related > rx plaquenil 2008 200 mg daily  - ACE level 05/24/16  55 on plaquenil 200 mg daily  - Spirometry 12/02/16  FVC 2.36 (85%) no obst  - Spirometry 05/27/2017  FEV1 2.25 (81%)  No obst with dlco 81 corrects to 109 for alv vol   - 05/27/2017  New  small linear plaque like lesion scalp line at occiput maybe 0.5 x 2 cm in dimension, hyperpigmented and prev bx proven sarcoid per pt   - 08/06/2018  Walked RA x 3 laps @ 185 ft each stopped due to  End of study, fast pace, no  desat  / min sob    - PFT's  09/15/2018  FEV1 1.95 (99 % ) ratio 82  p 5 % improvement from saba p nothing prior to study with DLCO  96 % corrects to 112  % for alv volume  With FVC 2.34  - PFT s  06/14/2020   No change  - April 15 22  slt increae nodular density R Upper lung zone not seen on lateral > recheck @ 6 m rec  Very little to suggest active lung dz as most pt's have much more asymptomatic but radiologically sinister looking involvement which is not the case here so just rec f/u cxr at 6 m  Rec >>>  No change in recommendations but if losing ground with control of skin/ joint/tear duct  on plaquenil and needing more courses of prednisone then I will be recommending a trial of plaquenil at 400 mg daily but the final call will be made by Dr Dossie Der

## 2021-05-22 NOTE — Patient Instructions (Addendum)
No change in recommendations but if losing ground on plaquenil and needing more prednisone then I will be recommending a trial of plaquenil at 400 mg daily but the final call will be made by Dr Dossie Der    Please schedule a follow up visit in 5  months   with cxr on return to follow up your sarcoidosis

## 2021-06-22 ENCOUNTER — Other Ambulatory Visit (HOSPITAL_COMMUNITY): Payer: Self-pay | Admitting: Rheumatology

## 2021-06-22 DIAGNOSIS — D860 Sarcoidosis of lung: Secondary | ICD-10-CM

## 2021-07-12 ENCOUNTER — Ambulatory Visit (HOSPITAL_COMMUNITY): Payer: 59 | Attending: Rheumatology

## 2021-07-12 ENCOUNTER — Other Ambulatory Visit: Payer: Self-pay

## 2021-07-12 DIAGNOSIS — D860 Sarcoidosis of lung: Secondary | ICD-10-CM | POA: Insufficient documentation

## 2021-07-12 LAB — ECHOCARDIOGRAM COMPLETE
Area-P 1/2: 6.12 cm2
S' Lateral: 2.2 cm

## 2021-07-17 ENCOUNTER — Other Ambulatory Visit: Payer: Self-pay | Admitting: Family Medicine

## 2021-07-17 DIAGNOSIS — Z1231 Encounter for screening mammogram for malignant neoplasm of breast: Secondary | ICD-10-CM

## 2021-07-17 DIAGNOSIS — M8580 Other specified disorders of bone density and structure, unspecified site: Secondary | ICD-10-CM

## 2021-10-22 ENCOUNTER — Encounter: Payer: Self-pay | Admitting: Internal Medicine

## 2021-10-22 ENCOUNTER — Other Ambulatory Visit: Payer: Self-pay

## 2021-10-22 ENCOUNTER — Ambulatory Visit (INDEPENDENT_AMBULATORY_CARE_PROVIDER_SITE_OTHER): Payer: 59 | Admitting: Internal Medicine

## 2021-10-22 DIAGNOSIS — D869 Sarcoidosis, unspecified: Secondary | ICD-10-CM | POA: Diagnosis not present

## 2021-10-22 NOTE — Patient Instructions (Signed)
Make sure you check your oxygen saturation at your highest level of activity to be sure it stays over 90% and keep track of it at least once a week, more often if breathing getting worse, and let me know if losing ground.     If you are satisfied with your treatment plan,  let your doctor know and he/she can either refill your medications or you can return here when your prescription runs out.     If in any way you are not 100% satisfied,  please tell us.  If 100% better, tell your friends!  Pulmonary follow up is as needed

## 2021-10-22 NOTE — Progress Notes (Signed)
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 Dossie Der. 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   all of the original symptoms that triggered intial evals dating back to age 60 have resolved at this  Point on just 200 mg plaquenil and Not limited by breathing from desired activities  / no cough  rec No indication for treatment     08/06/2018   extended ov/Kristy Morse [id number] eyes/skin  new sob at rest not reproduced with ex  Chief Complaint  Patient presents with   Follow-up    increased SOB x 1 month- comes and goes and is mild.   feels fine walking level x 30 min x fast pace x 2x daily  /  Sob occurs at rest, better p 2 breaths/ not at hs Onset x one month prior to OV ,  comes on sev times a day, never sleeping/ no clear trigger  No cough / rash about same on plaquenil 200 mg daily  rec Try off boniva  For now and continue regular walking at a pace where you are slightly short of breath but never out of breath  Please remember to go to the  x-ray department downstairs in the basement  for your tests - we will call you with the results when they are available.  Please schedule a follow up office visit in 6 weeks, call sooner if needed with PFTs on return    09/15/2018  f/u ov/Kristy Morse re: sarcoid with skin/ eye / still on  boniva, sob resolved  Chief Complaint  Patient presents with   Follow-up    PFT's done on 09/14/18. She states her breathing has improved. No new co's.   Dyspnea:  Ex x 30 min s difficulty  Cough: no Sleeping: 2 pillows/ bed flat  SABA use: none 02: none  rec If breathing or coughing worse, try off boniva first then return to this clinic after a month off it.    05/22/2021  f/u ov/Kristy Morse re: Dossie Der f/u on plaquenil 200 with skin/ joint/tear ducts min pulmonary involvement  Chief Complaint  Patient presents with   Follow-up    Breathing is doing well and no new co's today.   Dyspnea:   2-3 days x 30 min each walk > run Cough: none  Sleeping: no resp complaints  SABA use: none  02: none Covid status:  vax  X 3  Rec No change in recommendations but if losing ground on plaquenil and needing more prednisone then I will be recommending a trial of plaquenil at 400 mg daily but the final call will be made by Dr Dossie Der  Please schedule a follow up visit in  5  months   with cxr on return to follow up your sarcoidosis    10/22/2021  f/u ov/Kristy Morse re: Dossie Der f/u on plaquenil 200 with skin/ joint/tear ducts min pulmonary involvement    maint on plaquenil 200 and mtx injections  started since last ov Chief Complaint  Patient presents with   Follow-up   Dyspnea:  limited by toes mostly just callesthenics now  Cough: none  Sleeping: no resp cc  SABA use: none  02: none  Covid status:   vax x 3    No obvious day to day or daytime variability or assoc excess/ purulent sputum or mucus plugs or hemoptysis or cp or chest tightness, subjective wheeze or overt sinus or hb symptoms.   Sleeping  without nocturnal  or early am exacerbation  of respiratory  c/o's or need for noct saba. Also denies any obvious fluctuation of symptoms with weather or environmental changes or other aggravating or alleviating factors except as outlined above   No unusual exposure hx or h/o childhood pna/ asthma or knowledge of  premature birth.  Current Allergies, Complete Past Medical History, Past Surgical History, Family History, and Social History were reviewed in [employer] record.  ROS  The following are not active complaints unless bolded Hoarseness, sore throat, dysphagia, dental problems, itching, sneezing,  nasal congestion or discharge of excess mucus or purulent secretions, ear ache,   fever, chills, sweats, unintended wt loss or wt gain, classically pleuritic or exertional cp,  orthopnea pnd or arm/hand swelling  or leg swelling, presyncope, palpitations, abdominal pain, anorexia, nausea, vomiting, diarrhea  or change in bowel habits or change in bladder habits, change in stools or change in urine, dysuria, hematuria,  rash, arthralgias, visual complaints, headache, numbness, weakness or ataxia or problems with walking or coordination,  change in mood or  memory.        Current Meds  Medication Sig   fluticasone (FLONASE) 50 MCG/ACT nasal spray Place 2 sprays into both nostrils daily.   folic acid (FOLVITE) 1 MG tablet 1 mg.   hydroxychloroquine (PLAQUENIL) 200 MG tablet Take 200 mg by mouth daily.   ibandronate (BONIVA) 150 MG tablet Take 150 mg by mouth every 30 (thirty) days. Take in the morning with a full glass of water, on an empty stomach, and do not take anything else by mouth or lie down for the next 30 min.   methotrexate 50 MG/2ML injection Inject 25 mg into the skin.   pantoprazole (PROTONIX) 40 MG tablet Take 40 mg by mouth 2 (two) times daily before a meal.   valACYclovir (VALTREX) 500 MG tablet Take 500 mg by mouth 2 (two) times daily as needed.               Objective:   Physical Exam      10/22/2021      146   05/22/2021      148   09/15/2018      158   08/06/18 159 lb 12.8 oz (72.5 kg)  05/27/17 150 lb (68 kg)     Vital signs reviewed  10/22/2021  - Note at rest 02 sats  100% on RA   General appearance:    amb pleasant bf nad    HEENT : pt wearing  mask not removed for exam due to covid -19 concerns.    NECK :  without JVD/Nodes/TM/ nl carotid upstrokes bilaterally   LUNGS: no acc muscle use,  Nl contour chest which  is clear to A and P bilaterally without cough on insp or exp maneuvers   CV:  RRR  no s3 or murmur or increase in P2, and no edema   ABD:  soft and nontender with nl inspiratory excursion in the supine position. No bruits or organomegaly appreciated, bowel sounds nl  MS:  Nl gait/ ext warm without deformities, calf tenderness, cyanosis or clubbing No obvious joint restrictions   SKIN: warm and dry with no change purplish plaques under R chin, post L neck and knees and elbows flex surfaces with extensive scarring  NEURO:  alert, approp, nl sensorium with  no motor or cerebellar deficits apparent.          Assessment:

## 2021-10-23 ENCOUNTER — Encounter: Payer: Self-pay | Admitting: Internal Medicine

## 2021-10-23 NOTE — Assessment & Plan Note (Signed)
Onset 2000 predominantly sinus / tear duct related > rx plaquenil 2008 200 mg daily  - ACE level 05/24/16  55 on plaquenil 200 mg daily  - Spirometry 12/02/16  FVC 2.36 (85%) no obst  - Spirometry 05/27/2017  FEV1 2.25 (81%)  No obst with dlco 81 corrects to 109 for alv vol   - 05/27/2017  New  small linear plaque like lesion scalp line at occiput maybe 0.5 x 2 cm in dimension, hyperpigmented and prev bx proven sarcoid per pt   - 08/06/2018  Walked RA x 3 laps @ 185 ft each stopped due to  End of study, fast pace, no  desat  / min sob    - PFT's  09/15/2018  FEV1 1.95 (99 % ) ratio 82  p 5 % improvement from saba p nothing prior to study with DLCO  96 % corrects to 112  % for alv volume  With FVC 2.34  - PFT s  06/14/2020   No change  - 10/22/2021   Pt now on MTX/ needs baseline cxr   No need for regular f/u but advised since on mtx now that she monitor sats at peak ex and f/u with me prn any decline over time  Pulmonary f/u can be prn          Each maintenance medication was reviewed in detail including emphasizing most importantly the difference between maintenance and prns and under what circumstances the prns are to be triggered using an action plan format where appropriate.  Total time for H and P, chart review, counseling,  and generating customized AVS unique to this office visit / same day charting = 20 min

## 2021-10-31 ENCOUNTER — Telehealth: Payer: Self-pay | Admitting: *Deleted

## 2021-10-31 ENCOUNTER — Encounter: Payer: Self-pay | Admitting: Neurology

## 2021-10-31 DIAGNOSIS — D869 Sarcoidosis, unspecified: Secondary | ICD-10-CM

## 2021-10-31 NOTE — Telephone Encounter (Signed)
Spoke with the pt and made aware of recommendations from Dr Melvyn Novas- she verbalized understanding. Was okay with cxr and I placed order for this.

## 2021-10-31 NOTE — Telephone Encounter (Signed)
-----   Message from Tanda Rockers, MD sent at 10/23/2021  6:06 AM EDT ----- Call pt to return at her convenience before the end of the year for cxr f/u sarcoid  - the April study we talked about at office was not a particularly good film and needs to be repeated at our office

## 2021-11-02 ENCOUNTER — Ambulatory Visit (INDEPENDENT_AMBULATORY_CARE_PROVIDER_SITE_OTHER): Payer: 59

## 2021-11-02 DIAGNOSIS — D869 Sarcoidosis, unspecified: Secondary | ICD-10-CM

## 2021-12-11 ENCOUNTER — Ambulatory Visit: Payer: 59 | Admitting: Neurology

## 2021-12-28 NOTE — Progress Notes (Signed)
NEUROLOGY CONSULTATION NOTE  CYNAI SKEENS MRN: [id number] DOB: 1961-05-27  Referring provider: Gaynelle Arabian, MD Primary care provider: Gaynelle Arabian, MD  Reason for consult:  balance disorder  Assessment/Plan:   Balance disorder - vague.  Felt herself leaning to the left for a few seconds upon standing up.  It may have been related to deconditioning.  An infarct involving the left cerebellar hemisphere is possible.  I offered checking an MRI of the brain.  Since she is doing well and symptoms have improved, she would like to defer for now.  Given her normal neurologic exam, I think this is reasonable.  If symptoms return, she was advised to follow up.     Subjective:  Kristy Morse is a 60 year old female with Sjogren's syndrome and sarcoidosis who presents for balance disorder.  History supplemented by referring provider's note.  In September, she noticed that when she would first stand up from a chair, she felt herself leaning towards the left.  It was brief.  She felt fine when walking.  No falls.  She denies feeling dizzy, either spinning or feeling like she will pass out.  No associated lower extremity weakness.  Denies numbness in the legs and feet.  No aura fullness, hearing loss or double vision.  She reports that her lifestyle was sedentary.  She began walking more and it has since resolved.  She has pulmonary sarcoidosis. She also has Sjogren's for which she takes Plaquenil. She is followed by rheumatology.   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   PAST MEDICAL HISTORY: No past medical history on file.  PAST SURGICAL HISTORY: Past Surgical History:  Procedure Laterality Date   ABDOMINAL HYSTERECTOMY      MEDICATIONS: Current Outpatient Medications on File Prior to Visit  Medication Sig  Dispense Refill   fluticasone (FLONASE) 50 MCG/ACT nasal spray Place 2 sprays into both nostrils daily.  3   folic acid (FOLVITE) 1 MG tablet 1 mg.     hydroxychloroquine (PLAQUENIL) 200 MG tablet Take 200 mg by mouth daily.     ibandronate (BONIVA) 150 MG tablet Take 150 mg by mouth every 30 (thirty) days. Take in the morning with a full glass of water, on an empty stomach, and do not take anything else by mouth or lie down for the next 30 min.     methotrexate 50 MG/2ML injection Inject 25 mg into the skin.     pantoprazole (PROTONIX) 40 MG tablet Take 40 mg by mouth 2 (two) times daily before a meal.     valACYclovir (VALTREX) 500 MG tablet Take 500 mg by mouth 2 (two) times daily as needed.     No current facility-administered medications on file prior to visit.    ALLERGIES: Allergies  Allergen Reactions   Doxycycline Hives   Metronidazole Hives   Septra [Sulfamethoxazole-Trimethoprim] Hives   Sulfa Antibiotics Hives    FAMILY HISTORY: Family History  Problem Relation Age of Onset   Colon cancer Father    Colon cancer Sister    Colon cancer Brother     Objective:  Blood pressure 135/81, pulse 69, height 5\' 4"  (1.626 m), weight 142 lb 3.2 oz (64.5 kg), SpO2 100 %. General: No acute distress.  Patient appears well-groomed.   Head:  Normocephalic/atraumatic Eyes:  fundi examined but not visualized  Neck: supple, no paraspinal tenderness, full range of motion Back: No paraspinal tenderness Heart: regular rate and rhythm Lungs: Clear to auscultation bilaterally. Vascular: No carotid bruits. Neurological Exam: Mental status: alert and oriented to person, place, and time, recent and remote memory intact, fund of knowledge intact, attention and concentration intact, speech fluent and not dysarthric, language intact. Cranial nerves: CN I: not tested CN II: pupils equal, round and reactive to light, visual fields intact CN III, IV, VI:  full range of motion, no nystagmus, no  ptosis CN V: facial sensation intact. CN VII: upper and lower face symmetric CN VIII: hearing intact CN IX, X: gag intact, uvula midline CN XI: sternocleidomastoid and trapezius muscles intact CN XII: tongue midline Bulk & Tone: normal, no fasciculations. Motor:  muscle strength 5/5 throughout Sensation:  Pinprick, temperature and vibratory sensation intact. Deep Tendon Reflexes:  2+ throughout,  toes downgoing.   Finger to nose testing:  Without dysmetria.   Heel to shin:  Without dysmetria.   Gait:  Normal station and stride.  Romberg negative.    Thank you for allowing me to take part in the care of this patient.  Metta Clines, DO  CC: Gaynelle Arabian, MD

## 2022-01-01 ENCOUNTER — Ambulatory Visit (INDEPENDENT_AMBULATORY_CARE_PROVIDER_SITE_OTHER): Payer: 59 | Admitting: Neurology

## 2022-01-01 ENCOUNTER — Other Ambulatory Visit: Payer: Self-pay

## 2022-01-01 ENCOUNTER — Ambulatory Visit: Payer: 59

## 2022-01-01 ENCOUNTER — Encounter: Payer: Self-pay | Admitting: Neurology

## 2022-01-01 VITALS — BP 135/81 | HR 69 | Ht 64.0 in | Wt 142.2 lb

## 2022-01-01 DIAGNOSIS — R2689 Other abnormalities of gait and mobility: Secondary | ICD-10-CM

## 2022-01-01 NOTE — Patient Instructions (Signed)
At this time, your exam looks normal.  We can check an MRI of the brain but it is reasonable to wait and monitor.  If symptoms return, please follow up.

## 2022-01-08 ENCOUNTER — Ambulatory Visit
Admission: RE | Admit: 2022-01-08 | Discharge: 2022-01-08 | Disposition: A | Discharge status: Home / Self Care | Payer: 59 | Source: Ambulatory Visit | Attending: Family Medicine | Admitting: Family Medicine

## 2022-01-08 DIAGNOSIS — Z1231 Encounter for screening mammogram for malignant neoplasm of breast: Secondary | ICD-10-CM

## 2022-01-08 DIAGNOSIS — M8580 Other specified disorders of bone density and structure, unspecified site: Secondary | ICD-10-CM

## 2022-02-13 ENCOUNTER — Ambulatory Visit: Payer: 59 | Admitting: Neurology

## 2022-06-27 ENCOUNTER — Other Ambulatory Visit: Payer: Self-pay

## 2022-06-27 DIAGNOSIS — R202 Paresthesia of skin: Secondary | ICD-10-CM

## 2022-09-24 ENCOUNTER — Encounter: Payer: 59 | Admitting: Neurology

## 2022-11-19 ENCOUNTER — Other Ambulatory Visit: Payer: Self-pay | Admitting: Family Medicine

## 2022-11-19 DIAGNOSIS — Z1231 Encounter for screening mammogram for malignant neoplasm of breast: Secondary | ICD-10-CM

## 2023-01-17 ENCOUNTER — Ambulatory Visit
Admission: RE | Admit: 2023-01-17 | Discharge: 2023-01-17 | Disposition: A | Discharge status: Home / Self Care | Payer: 59 | Source: Ambulatory Visit | Attending: Family Medicine | Admitting: Family Medicine

## 2023-01-17 DIAGNOSIS — Z1231 Encounter for screening mammogram for malignant neoplasm of breast: Secondary | ICD-10-CM

## 2023-05-14 ENCOUNTER — Ambulatory Visit (INDEPENDENT_AMBULATORY_CARE_PROVIDER_SITE_OTHER): Payer: 59

## 2023-05-14 ENCOUNTER — Ambulatory Visit (INDEPENDENT_AMBULATORY_CARE_PROVIDER_SITE_OTHER): Payer: 59 | Admitting: Internal Medicine

## 2023-05-14 ENCOUNTER — Encounter: Payer: Self-pay | Admitting: Internal Medicine

## 2023-05-14 VITALS — BP 136/78 | HR 75 | Temp 98.3°F | Ht 64.0 in | Wt 140.2 lb

## 2023-05-14 DIAGNOSIS — D869 Sarcoidosis, unspecified: Secondary | ICD-10-CM

## 2023-05-14 NOTE — Patient Instructions (Addendum)
Please remember to go to the  x-ray department  for your tests - we will call you with the results when they are available    Consult Dr Everardo All next available   Follow up here is as needed

## 2023-05-14 NOTE — Progress Notes (Signed)
Subjective:     Patient ID: Kristy Morse, female   DOB: 11-Apr-1961,     MRN: [id number]     Brief patient profile:  37  yobf never smoker with sinus problems since her 30's got worse >  ENT eval around age 62 found sarcoid changes > rx nasal sparys helped a lot then moved to [facility] 2008 with cc new excessive tearing of   eyes > Dr Jimmey Ralph eval with ocular sarcoid / tearduct obst rx plaquenil by [facility] > improved p tearduct surgery and contineud plaquenil since then > referred to [facility] 2017 > cxr and pft done and referred to pulmonary clinic 05/27/2017 by Dr   Kathi Ludwig     History of Present Illness  05/27/2017 1st [facility] office visit/ Adrea Sherpa   Chief Complaint  Patient presents with   Pulmonary Consult    Referred by Dr. Kathi Ludwig. 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   all of the original symptoms that triggered intial evals dating back to age 62 have resolved at this  Point on just 200 mg plaquenil and Not limited by breathing from desired activities  / no cough  rec No indication for treatment     08/06/2018   extended ov/Doren Kaspar [id number] eyes/skin  new sob at rest not reproduced with ex  Chief Complaint  Patient presents with   Follow-up    increased SOB x 1 month- comes and goes and is mild.   feels fine walking level x 30 min x fast pace x 2x daily  /  Sob occurs at rest, better p 2 breaths/ not at hs Onset x one month prior to OV ,  comes on sev times a day, never sleeping/ no clear trigger  No cough / rash about same on plaquenil 200 mg daily  rec Try off boniva  For now and continue regular walking at a pace where you are slightly short of breath but never out of breath  Please remember to go to the  x-ray department downstairs in the basement  for your tests - we will call you with the results when they are available.  Please schedule a follow up office visit in 6 weeks, call sooner if needed with PFTs on return    09/15/2018  f/u ov/Kiran Carline re: sarcoid with skin/ eye / still on  boniva, sob resolved  Chief Complaint  Patient presents with   Follow-up    PFT's done on 09/14/18. She states her breathing has improved. No new co's.   Dyspnea:  Ex x 30 min s difficulty  Cough: no Sleeping: 2 pillows/ bed flat  SABA use: none 02: none  rec If breathing or coughing worse, try off boniva first then return to this clinic after a month off it.    05/22/2021  f/u ov/Ichael Pullara re: Kathi Ludwig f/u on plaquenil 200 with skin/ joint/tear ducts min pulmonary involvement  Chief Complaint  Patient presents with   Follow-up    Breathing is doing well and no new co's today.   Dyspnea:   2-3 days x 30 min each walk > run Cough: none  Sleeping: no resp complaints  SABA use: none  02: none Covid status:  vax  X 3  Rec No change in recommendations but if losing ground on plaquenil and needing more prednisone then I will be recommending a trial of plaquenil at 400 mg daily but the final call will be made by Dr Kathi Ludwig  Please schedule a follow up visit in  5  months   with cxr on return to follow up your sarcoidosis    10/22/2021  f/u ov/Lexii Walsh re: Kathi Ludwig f/u on plaquenil 200 with skin/ joint/tear ducts min pulmonary involvement    maint on plaquenil 200 and mtx injections  started since last ov Chief Complaint  Patient presents with   Follow-up   Dyspnea:  limited by toes mostly just callesthenics now  Cough: none  Sleeping: no resp cc  SABA use: none  02: none  Covid status:   vax x 3 Rec Make sure you check your oxygen saturation at your highest level of activity to be sure it stays over 90% F/u prn     05/14/2023  f/u ov/Jabier Deese re: sarcoid / skin, joints,rhinitis  maint on prednisone 10 x 2 daily x years  and plaquenil   Chief Complaint  Patient presents with   Follow-up    Doing well.  Would like a yearly cxr.   Dyspnea:  no aerobics but very active = powerwalks x x 3x weekly some hills Cough: none  Sleeping: flat bed, 3 pillows  SABA use: none  02: none  Eye doctor yearly   Stays mostly at 20 due to skin/ does not recall using plaquenil 400        No obvious day to day or daytime variability or assoc excess/ purulent sputum or mucus plugs or hemoptysis or cp or chest tightness, subjective wheeze or overt sinus or hb symptoms.   sleeping without nocturnal  or early am exacerbation  of respiratory  c/o's or need for noct saba. Also denies any obvious fluctuation of symptoms with weather or environmental changes or other aggravating or alleviating factors except as outlined above   No unusual exposure hx or h/o childhood pna/ asthma or knowledge of premature birth.  Current Allergies, Complete Past Medical History, Past Surgical History, Family History, and Social History were reviewed in [employer] record.  ROS  The following are not active complaints unless bolded Hoarseness, sore throat, dysphagia, dental problems, itching, sneezing,  nasal congestion or discharge of excess mucus or purulent secretions, ear ache,   fever, chills, sweats, unintended wt loss or wt gain, classically pleuritic or exertional cp,  orthopnea pnd or arm/hand swelling  or leg swelling, presyncope, palpitations, abdominal pain, anorexia, nausea, vomiting, diarrhea  or change in bowel habits or change in bladder habits, change in stools or change in urine, dysuria, hematuria,  rash, arthralgias, visual complaints, headache, numbness, weakness or ataxia or problems with walking or coordination,  change in mood or  memory.        Current Meds  Medication Sig   fluticasone (FLONASE) 50 MCG/ACT nasal spray Place 2 sprays into both nostrils daily.   hydroxychloroquine (PLAQUENIL) 200 MG tablet Take 200 mg by mouth daily.   ibandronate (BONIVA) 150 MG tablet Take 150 mg by mouth every 30 (thirty) days. Take in the morning with a full glass of water, on an empty stomach, and do not take anything else by mouth or lie down for the next 30 min.   pantoprazole (PROTONIX) 40 MG  tablet Take 40 mg by mouth 2 (two) times daily before a meal.   valACYclovir (VALTREX) 500 MG tablet Take 500 mg by mouth 2 (two) times daily as needed.                Objective:   Physical Exam   wts   05/14/2023  140  10/22/2021      146   05/22/2021      148   09/15/2018      158   08/06/18 159 lb 12.8 oz (72.5 kg)  05/27/17 150 lb (68 kg)     Vital signs reviewed  05/14/2023  - Note at rest 02 sats  97% on RA   General appearance:    pleasant healthy appearing amb bf nad   HEENT : Oropharynx  clear     Nasal turbinates mild edema/ no polps or masses    NECK :  without  apparent JVD/ palpable Nodes/TM    LUNGS: no acc muscle use,  Nl contour chest which is clear to A and P bilaterally without cough on insp or exp maneuvers   CV:  RRR  no s3 or murmur or increase in P2, and no edema   ABD:  soft and nontender with nl inspiratory excursion in the supine position. No bruits or organomegaly appreciated   MS:  Nl gait/ ext warm without deformities Or obvious joint restrictions  calf tenderness, cyanosis or clubbing    SKIN: warm and dry with classic sarcoid rash over nose esp on R and inferior to R mandible near TMJ  - No palp nodes/ no EN  NEURO:  alert, approp, nl sensorium with  no motor or cerebellar deficits apparent.       Labs per Dr Kathi Ludwig    CXR PA and Lateral:   05/14/2023 :    I personally reviewed images and impression is as follows:     Subtle RN changes/ no convincing change from prior    Assessment:

## 2023-05-16 NOTE — Assessment & Plan Note (Signed)
Onset 2000 predominantly sinus / tear duct related > rx plaquenil 2008 200 mg daily  - ACE level 05/24/16  55 on plaquenil 200 mg daily  - Spirometry 12/02/16  FVC 2.36 (85%) no obst  - Spirometry 05/27/2017  FEV1 2.25 (81%)  No obst with dlco 81 corrects to 109 for alv vol   - 05/27/2017  New  small linear plaque like lesion scalp line at occiput maybe 0.5 x 2 cm in dimension, hyperpigmented and prev bx proven sarcoid per pt   - 08/06/2018  Walked RA x 3 laps @ 185 ft each stopped due to  End of study, fast pace, no  desat  / min sob   - PFT's  09/15/2018  FEV1 1.95 (99 % ) ratio 82  p 5 % improvement from saba p nothing prior to study with DLCO  96 % corrects to 112  % for alv volume  With FVC 2.34  - PFT s  06/14/2020   No change  - 10/22/2021   Pt now on MTX > d/c'd ? 2023  - referred to Dr Everardo All 05/14/2023 for second pulmonary opinion  Concerned she still requires 20 mg per day prednisone which she appears to be titrating related to skin dz and not pulmonary issues but would like her to see Dr Everardo All for a second look at options to suggest to Dr Kathi Ludwig.          Each maintenance medication was reviewed in detail including emphasizing most importantly the difference between maintenance and prns and under what circumstances the prns are to be triggered using an action plan format where appropriate.  Total time for H and P, chart review, counseling,   and generating customized AVS unique to this office visit / same day charting = 23 min

## 2023-05-19 ENCOUNTER — Telehealth: Payer: Self-pay | Admitting: Internal Medicine

## 2023-05-19 NOTE — Telephone Encounter (Signed)
Patient calling back for xray results. 

## 2023-05-19 NOTE — Telephone Encounter (Signed)
Call pt:  Reviewed cxr and no convincing  acute change but do support dx of sarcoidosis from which has been referred to Dr Everardo All for 2nd opinion or management longterm   Called the pt and there was no answer- LMTCB.

## 2023-05-19 NOTE — Progress Notes (Signed)
Called the pt and there was no answer- LMTCB    

## 2023-05-19 NOTE — Telephone Encounter (Signed)
Patient is returning phone call. Patient phone number is [phone].

## 2023-05-21 NOTE — Telephone Encounter (Signed)
Called and spoke with pt letting her know the results of cxr and she verbalized understanding. Nothing further needed. 

## 2023-05-23 ENCOUNTER — Ambulatory Visit (INDEPENDENT_AMBULATORY_CARE_PROVIDER_SITE_OTHER): Payer: 59 | Admitting: Neurology

## 2023-05-23 ENCOUNTER — Encounter: Payer: Self-pay | Admitting: Neurology

## 2023-05-23 VITALS — BP 144/76 | HR 71 | Ht 64.0 in | Wt 139.0 lb

## 2023-05-23 DIAGNOSIS — R2689 Other abnormalities of gait and mobility: Secondary | ICD-10-CM

## 2023-05-23 NOTE — Progress Notes (Addendum)
NEUROLOGY FOLLOW UP OFFICE NOTE  Kristy Morse [id number]  Assessment/Plan:   Balance disorder - recurrence only once she started exercising.  Does not exhibit signs or symptoms of myelopathy, lumbar spinal stenosis/radiculopathy, polyneuropathy, myopathy or objective muscle weakness.  As symptoms are subtle, any objective finding may be missed.  And since symptoms have recurred, will check MRI of brain.  If unrevealing, likely related to age and deconditioning as she had not been that physically active.  In which case, she should find improvement with continuation of exercise. Further recommendations pending results.  07/22/2023 ADDENDUM:  MRI of brain on 07/18/2023 was unremarkable without explanation for balance disorder or concerning findings.  Neurologic exam is unremarkable.  No evidence of muscle weakness or neuropathy on exam/testing.  Suspect deconditioning.  Continue exercise and follow up with PCP.     Subjective:  Kristy Morse is a 62 year old female with Sjogren's syndrome and sarcoidosis who follows up for balance disorder.  UPDATE: She started exercising about 3 months ago.  She noticed that when she would side step during exercise, or while walking, she felt herself leaning to the left again.  Her legs felt slower or incoordinated.  Occurs only while in that activity.  No weakness or numbness.  She did have left knee pain for which she went to PT, but otherwise no lower extremity pain, back pain or neck pain.  No symptoms involving the upper extremities.    HISTORY:   In September 2022, she noticed that when she would first stand up from a chair, she felt herself leaning towards the left.  It was brief.  She felt fine when walking.  No falls.  She denies feeling dizzy, either spinning or feeling like she will pass out.  No associated lower extremity weakness.  Denies numbness in the legs and feet.  No aura fullness, hearing loss or double vision.  She reports that her lifestyle  was sedentary.  She began walking more and by January 2023, symptoms resolved.   She has pulmonary sarcoidosis. She also has Sjogren's for which she takes Plaquenil. She is followed by rheumatology.   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   PAST MEDICAL HISTORY: Past Medical History:  Diagnosis Date   GERD (gastroesophageal reflux disease)     MEDICATIONS: Current Outpatient Medications on File Prior to Visit  Medication Sig Dispense Refill   fluticasone (FLONASE) 50 MCG/ACT nasal spray Place 2 sprays into both nostrils daily.  3   hydroxychloroquine (PLAQUENIL) 200 MG tablet Take 200 mg by mouth daily.     ibandronate (BONIVA) 150 MG tablet Take 150 mg by mouth every 30 (thirty) days. Take in the morning with a full glass of water, on an empty stomach, and do not take anything else by mouth or lie down for the next 30 min.     pantoprazole (PROTONIX) 40 MG tablet Take 40 mg by mouth 2 (two) times daily before a meal.     valACYclovir (VALTREX) 500 MG tablet Take 500 mg by mouth 2 (two) times daily as needed.     No current facility-administered medications on file prior to visit.    ALLERGIES: Allergies  Allergen Reactions   Doxycycline Hives   Metronidazole Hives   Septra [Sulfamethoxazole-Trimethoprim] Hives   Sulfa Antibiotics Hives    FAMILY  HISTORY: Family History  Problem Relation Age of Onset   Colon cancer Father    Dementia Sister    Colon cancer Sister    Dementia Brother    Colon cancer Brother       Objective:  Blood pressure (!) 144/76, pulse 71, height 5\' 4"  (1.626 m), weight 139 lb (63 kg), SpO2 98 %. General: No acute distress.  Patient appears well-groomed.   Head:  Normocephalic/atraumatic Eyes:  Fundi examined but not visualized Neck: supple, no paraspinal tenderness, full  range of motion Heart:  Regular rate and rhythm Lungs:  Clear to auscultation bilaterally Back: No paraspinal tenderness Neurological Exam: alert and oriented.  Speech fluent and not dysarthric, language intact.  CN II-XII intact. Bulk and tone normal, muscle strength 5/5 throughout.  Sensation to light touch intact.  Deep tendon reflexes 2+ throughout, toes downgoing.  Finger to nose and heel to shin testing intact.  Gait normal, Romberg negative.   Shon Millet, DO  CC:  Haydee Salter, MD

## 2023-05-23 NOTE — Patient Instructions (Signed)
Will order MRI of brain for balance problems.  Further recommendations pending results.  If unremarkable, likely balance problem is related to deconditioning.  Continued exercises should help.

## 2023-06-10 ENCOUNTER — Ambulatory Visit (HOSPITAL_BASED_OUTPATIENT_CLINIC_OR_DEPARTMENT_OTHER): Payer: 59 | Admitting: Pulmonary Disease

## 2023-06-16 ENCOUNTER — Other Ambulatory Visit: Payer: Self-pay | Admitting: Family Medicine

## 2023-06-16 DIAGNOSIS — Z0000 Encounter for general adult medical examination without abnormal findings: Secondary | ICD-10-CM

## 2023-06-23 ENCOUNTER — Telehealth: Payer: Self-pay

## 2023-06-23 ENCOUNTER — Ambulatory Visit
Admission: RE | Admit: 2023-06-23 | Discharge: 2023-06-23 | Disposition: A | Discharge status: Home / Self Care | Payer: 59 | Source: Ambulatory Visit | Attending: Emergency Medicine | Admitting: Emergency Medicine

## 2023-06-23 VITALS — BP 161/82 | HR 61 | Temp 98.4°F | Resp 20

## 2023-06-23 DIAGNOSIS — S91011A Laceration without foreign body, right ankle, initial encounter: Secondary | ICD-10-CM

## 2023-06-23 HISTORY — DX: Sarcoidosis, unspecified: D86.9

## 2023-06-23 MED ORDER — AMOXICILLIN-POT CLAVULANATE 875-125 MG PO TABS: 1.0000 | ORAL_TABLET | Freq: Two times a day (BID) | ORAL | 0 refills | Status: DC

## 2023-06-23 NOTE — ED Provider Notes (Signed)
[facility]    CSN: [id number] Arrival date & time: 06/23/23  1234      History   Chief Complaint Chief Complaint  Patient presents with   Ankle Laceration   Appt 1300    HPI Kristy Morse is a 62 y.o. female.   Approximately 3 days ago patient was walking and her screen door cut the backside of her right ankle.  Patient has been treating with peroxide and a Band-Aid.  Swelling increases in redness concerning patient.  Denies any fever no nausea vomiting diarrhea denies any pain at this time.  No noted drainage to the area    Past Medical History:  Diagnosis Date   GERD (gastroesophageal reflux disease)    Sarcoidosis     Patient Active Problem List   Diagnosis Date Noted   SOB (shortness of breath) 08/07/2018   Sarcoidosis 05/27/2017    Past Surgical History:  Procedure Laterality Date   ABDOMINAL HYSTERECTOMY      OB History   No obstetric history on file.      Home Medications    Prior to Admission medications   Medication Sig Start Date End Date Taking? Authorizing Provider  amoxicillin-clavulanate (AUGMENTIN) 875-125 MG tablet Take 1 tablet by mouth every 12 (twelve) hours. 06/23/23  Yes Coralyn Mark, NP  fluticasone (FLONASE) 50 MCG/ACT nasal spray Place 2 sprays into both nostrils daily. 08/25/18  Yes [provider]  hydroxychloroquine (PLAQUENIL) 200 MG tablet Take 200 mg by mouth daily.   Yes [provider]  ibandronate (BONIVA) 150 MG tablet Take 150 mg by mouth every 30 (thirty) days. Take in the morning with a full glass of water, on an empty stomach, and do not take anything else by mouth or lie down for the next 30 min.   Yes [provider]  pantoprazole (PROTONIX) 40 MG tablet Take 40 mg by mouth 2 (two) times daily before a meal.   Yes [provider]  valACYclovir (VALTREX) 500 MG tablet Take 500 mg by mouth 2 (two) times daily as needed.   Yes [provider]    Family  History Family History  Problem Relation Age of Onset   Colon cancer Father    Dementia Sister    Colon cancer Sister    Dementia Brother    Colon cancer Brother     Social History Social History   Tobacco Use   Smoking status: Never   Smokeless tobacco: Never  Vaping Use   Vaping Use: Never used  Substance Use Topics   Alcohol use: No   Drug use: No     Allergies   Doxycycline, Metronidazole, Septra [sulfamethoxazole-trimethoprim], and Sulfa antibiotics   Review of Systems Review of Systems  Constitutional:  Negative for fever.  Respiratory: Negative.    Cardiovascular: Negative.   Gastrointestinal: Negative.   Musculoskeletal: Negative.   Skin:  Positive for wound. Negative for rash.       Slight redness around the incision , approx 4cm laceration to right posterior ankle  Neurological: Negative.      Physical Exam Triage Vital Signs ED Triage Vitals  Enc Vitals Group     BP 06/23/23 1327 (!) 161/82     Pulse Rate 06/23/23 1327 61     Resp 06/23/23 1327 20     Temp 06/23/23 1327 98.4 F (36.9 C)     Temp Source 06/23/23 1327 Oral     SpO2 06/23/23 1327 94 %  Weight --      Height --      Head Circumference --      Peak Flow --      Pain Score 06/23/23 1328 1     Pain Loc --      Pain Edu? --      Excl. in GC? --    No data found.  Updated Vital Signs BP (!) 161/82   Pulse 61   Temp 98.4 F (36.9 C) (Oral)   Resp 20   SpO2 94%   Visual Acuity Right Eye Distance:   Left Eye Distance:   Bilateral Distance:    Right Eye Near:   Left Eye Near:    Bilateral Near:     Physical Exam Constitutional:      Appearance: Normal appearance.  Cardiovascular:     Rate and Rhythm: Normal rate.  Pulmonary:     Effort: Pulmonary effort is normal.  Musculoskeletal:        General: Swelling, tenderness and signs of injury present.  Skin:    Capillary Refill: Capillary refill takes less than 2 seconds.     Findings: Erythema present. No rash.      Comments: Approximately 4 cm laceration to right posterior ankle slight erythema no drainage noted.  Approximately 3 days ago not able to suture.  Neurological:     General: No focal deficit present.     Mental Status: She is alert.      UC Treatments / Results  Labs (all labs ordered are listed, but only abnormal results are displayed) Labs Reviewed - No data to display  EKG   Radiology No results found.  Procedures Procedures (including critical care time)  Medications Ordered in UC Medications - No data to display  Initial Impression / Assessment and Plan / UC Course  I have reviewed the triage vital signs and the nursing notes.  Pertinent labs & imaging results that were available during my care of the patient were reviewed by me and considered in my medical decision making (see chart for details).     Discussed with patient she will need to wash and dry the area really well apply Neosporin and keep covered for at least 7 days Discussed with patient to monitor for streaking if this occurs she will need to be seen in the emergency room Discussed since incision is greater than 24 hours old we are not able to apply any sutures the area potentially could have needed sutures at time of injury. Take full dose of antibiotics with food Take Tylenol as needed for pain Notify pCP for follow-up care Final Clinical Impressions(s) / UC Diagnoses   Final diagnoses:  Laceration of right ankle, initial encounter     Discharge Instructions      Discussed with patient she will need to wash and dry the area really well apply Neosporin and keep covered for at least 7 days Discussed with patient to monitor for streaking if this occurs she will need to be seen in the emergency room Discussed since incision is greater than 24 hours old we are not able to apply any sutures the area potentially could have needed sutures at time of injury. Take full dose of antibiotics with  food Take Tylenol as needed for pain Notify pCP for follow-up care     ED Prescriptions     Medication Sig Dispense Auth. Provider   amoxicillin-clavulanate (AUGMENTIN) 875-125 MG tablet Take 1 tablet by mouth every 12 (twelve) hours.  14 tablet Coralyn Mark, NP      PDMP not reviewed this encounter.   Coralyn Mark, NP 06/23/23 1351

## 2023-06-23 NOTE — ED Triage Notes (Signed)
Pt reports walking in a doorway 3 days ago when the screen door hit the back of her, causing a laceration to posterior right ankle. Laceration noted with some swelling and slight redness. Denies any drainage. RLE CMS intact. States immediately cleansed with H2O2 and has kept covered with Bandaid.

## 2023-06-23 NOTE — Discharge Instructions (Signed)
Discussed with patient she will need to wash and dry the area really well apply Neosporin and keep covered for at least 7 days Discussed with patient to monitor for streaking if this occurs she will need to be seen in the emergency room Discussed since incision is greater than 24 hours old we are not able to apply any sutures the area potentially could have needed sutures at time of injury. Take full dose of antibiotics with food Take Tylenol as needed for pain Notify pCP for follow-up care

## 2023-06-24 ENCOUNTER — Telehealth: Payer: Self-pay | Admitting: Internal Medicine

## 2023-06-24 MED ORDER — CEPHALEXIN 500 MG PO CAPS: 500.0000 mg | ORAL_CAPSULE | Freq: Four times a day (QID) | ORAL | 0 refills | Status: AC

## 2023-06-24 NOTE — Telephone Encounter (Addendum)
Patient called stating that she was allergic to Augmentin that was prescribed at previous urgent care visit yesterday.  Patient states that she is able to take cephalexin so will discontinue Augmentin and prescribe cephalexin.  It appears the patient was seen for possible infection due to a laceration so this medication should be reasonable. Crcl appears normal so no dosage adjustment necessary.

## 2023-06-26 ENCOUNTER — Encounter (HOSPITAL_BASED_OUTPATIENT_CLINIC_OR_DEPARTMENT_OTHER): Payer: Self-pay | Admitting: Pulmonary Disease

## 2023-06-26 ENCOUNTER — Ambulatory Visit (INDEPENDENT_AMBULATORY_CARE_PROVIDER_SITE_OTHER): Payer: 59 | Admitting: Pulmonary Disease

## 2023-06-26 VITALS — BP 140/80 | HR 64 | Temp 98.0°F | Ht 64.0 in | Wt 140.4 lb

## 2023-06-26 DIAGNOSIS — D869 Sarcoidosis, unspecified: Secondary | ICD-10-CM | POA: Diagnosis not present

## 2023-06-26 NOTE — Progress Notes (Signed)
Subjective:   PATIENT ID: Kristy Morse GENDER: female DOB: Nov 16, 1961, MRN: [id number]  Chief Complaint  Patient presents with   Follow-up    Patient Is here to talk about sarcoid.     Reason for Visit: New patient, previously followed Dr. Sherene Sires  [name] is a 62 year old never smoker with sarcoid with sinus and skin involvement who presents as a new patient to me.  She was diagnosed with sarcoid in 2000 that initially presented to lacrimation due to sarcoid involvement with her tear duct/sinuses s/p bilateral DCR. She is followed by [facility].  She is also followed by Dr. Kathi Ludwig with Rheumatologist for intermittent sarcoid flares. She is on Plaquenil. She was started on Hyrimoz last week for worsening skin lesions in the last 8 years. Previously on methotrexate however stopped due to GI upset and leukopenia. Dermatology is also involved and has her on tacrolimus and triamcinolone for her skin lesions. She is followed by Dr. Allyne Gee at [facility] for routine exam every 6 months - never was dx with ocular sarcoid.  Social History: Never smoker  I have personally reviewed patient's past medical/family/social history, allergies, current medications.  Past Medical History:  Diagnosis Date   GERD (gastroesophageal reflux disease)    Sarcoidosis      Family History  Problem Relation Age of Onset   Colon cancer Father    Dementia Sister    Colon cancer Sister    Dementia Brother    Colon cancer Brother      Social History   Occupational History   Occupation: Admin work  Tobacco Use   Smoking status: Never   Smokeless tobacco: Never  Building services engineer Use: Never used  Substance and Sexual Activity   Alcohol use: No   Drug use: No   Sexual activity: Not on file    Allergies  Allergen Reactions   Doxycycline Hives   Metronidazole Hives   Septra [Sulfamethoxazole-Trimethoprim] Hives   Sulfa Antibiotics Hives     Outpatient Medications Prior to Visit   Medication Sig Dispense Refill   cephALEXin (KEFLEX) 500 MG capsule Take 1 capsule (500 mg total) by mouth 4 (four) times daily for 5 days. 20 capsule 0   clobetasol (TEMOVATE) 0.05 % external solution Apply 1 Application topically daily as needed.     Fluocinolone Acetonide Scalp 0.01 % OIL 1 APPLICATION TO AFFECTED AREA TWICE A DAY MONDAY THRU FRIDAY ONLY for 30     fluticasone (FLONASE) 50 MCG/ACT nasal spray Place 2 sprays into both nostrils daily.  3   hydroxychloroquine (PLAQUENIL) 200 MG tablet Take 200 mg by mouth daily.     HYRIMOZ 40 MG/0.4ML SOAJ Inject into the skin.     ibandronate (BONIVA) 150 MG tablet Take 150 mg by mouth every 30 (thirty) days. Take in the morning with a full glass of water, on an empty stomach, and do not take anything else by mouth or lie down for the next 30 min.     pantoprazole (PROTONIX) 40 MG tablet Take 40 mg by mouth 2 (two) times daily before a meal.     sucralfate (CARAFATE) 1 g tablet SMARTSIG:1 Tablet(s) By Mouth 1-4 Times Daily     tacrolimus (PROTOPIC) 0.1 % ointment SMARTSIG:1 Topical Daily     valACYclovir (VALTREX) 500 MG tablet Take 500 mg by mouth 2 (two) times daily as needed.     No facility-administered medications prior to visit.    Review  of Systems  Constitutional:  Negative for chills, diaphoresis, fever, malaise/fatigue and weight loss.  HENT:  Negative for congestion.   Respiratory:  Negative for cough, hemoptysis, sputum production, shortness of breath and wheezing.   Cardiovascular:  Negative for chest pain, palpitations and leg swelling.     Objective:   Vitals:   06/26/23 1328  BP: (!) 140/80  Pulse: 64  Temp: 98 F (36.7 C)  TempSrc: Oral  SpO2: 97%  Weight: 140 lb 6.4 oz (63.7 kg)  Height: 5\' 4"  (1.626 m)   SpO2: 97 % O2 Device: None (Room air)  Physical Exam: General: Well-appearing, no acute distress HENT: [place], AT Eyes: EOMI, no scleral icterus Respiratory: Clear to auscultation bilaterally.  No  crackles, wheezing or rales Cardiovascular: RRR, -M/R/G, no JVD Extremities:-Edema,-tenderness Neuro: AAO x4, CNII-XII grossly intact Psych: Normal mood, normal affect Skin: Pitting areas with hyperpigmentation involving the face, arm and legs  Data Reviewed:  Imaging: CXR 05/14/23 - tiny right sided nodules likely related to sarcoid  PFT: 06/14/20 FVC 2.08 (77%) FEV1 1.74 (82%) Ratio 80  TLC 80% DLCO 99% Interpretation: No obstructive or restrictive defect present  Labs:    Latest Ref Rng & Units 01/30/2019    3:41 AM  CBC  WBC 4.0 - 10.5 K/uL 4.5   Hemoglobin 12.0 - 15.0 g/dL 16.1   Hematocrit 09.6 - 46.0 % 40.6   Platelets 150 - 400 K/uL 230       Latest Ref Rng & Units 01/30/2019    3:41 AM  CMP  Glucose 70 - 99 mg/dL 90   BUN 6 - 20 mg/dL 14   Creatinine 0.45 - 1.00 mg/dL 4.09   Sodium 811 - 914 mmol/L 139   Potassium 3.5 - 5.1 mmol/L 3.8   Chloride 98 - 111 mmol/L 105   CO2 22 - 32 mmol/L 25   Calcium 8.9 - 10.3 mg/dL 9.6   Total Protein 6.5 - 8.1 g/dL 8.1   Total Bilirubin 0.3 - 1.2 mg/dL 1.0   Alkaline Phos 38 - 126 U/L 52   AST 15 - 41 U/L 26   ALT 0 - 44 U/L 20         Assessment & Plan:   Discussion: 62 year old never smoker with sarcoid with sinus and skin involvement who presents as a new patient to me. Currently on immunosuppressants managed by Rheumatology.  We discussed the clinical course of sarcoid and management including serial PFTs, labs, eye exam, and EKG and chest imaging if indicated. 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 Followed by ENT 2010 began developing skin lesions - progressive per patient Followed by Dermatology Followed by Rheumatology for immunosuppression management  Pulmonary sarcoidosis --Abnormal CT. ORDER CT Chest without contrast when next available --ORDER pulmonary  function test in 3 months (Sept/October)  History of immunosuppression High risk medication management Per Rheumatology  Sarcoid Monitoring --Recent chest imaging reviewed.  --Annual PFTs.  Last PFTs 2021 --Annual ophthalmology exam.  --Recent EKG and TTE reviewed. No evidence of conduction abnormalities. --Routine labs as needed: CBC with diff, CMET, 1, 25 and 25 hydroxy vitamin D, urinary calcium  Health Maintenance Immunization History  Administered Date(s) Administered   Influenza,inj,Quad PF,6+ Mos 10/16/2021   PFIZER(Purple Top)SARS-COV-2 Vaccination 03/12/2020, 04/25/2020   Td 12/31/2007, 06/13/2017   Tdap 04/05/2008, 12/31/2011   CT Lung Screen - never smoker.  Not qualified  Orders Placed This Encounter  Procedures   CT Chest Wo Contrast    [facility] OTHER Wt 140/No Needs/ /No spinal cord/No body injector/no glucose mon/no heart monitor//ab w/pt Please remember if you need to cancel your appt, please do so 24 hours prior to your appointment to avoid getting charged a no-show fee of $75.00 pt is aware/pt verbal understood instructions given    Standing Status:   Future    Standing Expiration Date:   06/25/2024    Order Specific Question:   Preferred imaging location?    Answer:   [facility]   Pulmonary function test    Standing Status:   Future    Standing Expiration Date:   06/25/2024    Order Specific Question:   Where should this test be performed?    Answer:   [facility]    Order Specific Question:   Full PFT: includes the following: basic spirometry, spirometry pre & post bronchodilator, diffusion capacity (DLCO), lung volumes    Answer:   Full PFT  No orders of the defined types were placed in this encounter.   Return in about 3 months (around 09/26/2023) for after PFT.  I have spent a total time of 50-minutes on the day of the appointment reviewing prior documentation, coordinating care and discussing medical diagnosis and plan with the  patient/family. Imaging, labs and tests included in this note have been reviewed and interpreted independently by me.  Cree Kunert Mechele Collin, MD [facility] 06/26/2023 1:58 PM  Office Number [phone]

## 2023-06-26 NOTE — Patient Instructions (Addendum)
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 Followed by ENT 2010 began developing skin lesions - progressive per patient Followed by Dermatology Followed by Rheumatology for immunosuppression management  >Please request records to be sent to:  Fax: [phone] Attn: Dr. Cliffton [facility]  Pulmonary sarcoidosis --Abnormal CT. ORDER CT Chest without contrast when next available --ORDER pulmonary function test in 3 months (Sept/October)

## 2023-07-09 ENCOUNTER — Encounter: Payer: Self-pay | Admitting: Neurology

## 2023-07-10 ENCOUNTER — Ambulatory Visit
Admission: RE | Admit: 2023-07-10 | Discharge: 2023-07-10 | Disposition: A | Discharge status: Home / Self Care | Payer: 59 | Source: Ambulatory Visit | Attending: Neurology | Admitting: Neurology

## 2023-07-10 DIAGNOSIS — R2689 Other abnormalities of gait and mobility: Secondary | ICD-10-CM

## 2023-07-17 ENCOUNTER — Encounter: Payer: Self-pay | Admitting: Neurology

## 2023-07-21 ENCOUNTER — Ambulatory Visit: Admission: RE | Admit: 2023-07-21 | Payer: 59 | Source: Ambulatory Visit

## 2023-07-21 DIAGNOSIS — D869 Sarcoidosis, unspecified: Secondary | ICD-10-CM

## 2023-10-07 ENCOUNTER — Ambulatory Visit: Payer: 59 | Admitting: Neurology

## 2023-10-09 ENCOUNTER — Ambulatory Visit: Payer: 59 | Admitting: Neurology

## 2023-11-05 ENCOUNTER — Ambulatory Visit: Payer: 59 | Admitting: Internal Medicine

## 2023-11-05 DIAGNOSIS — D869 Sarcoidosis, unspecified: Secondary | ICD-10-CM

## 2023-11-05 LAB — PULMONARY FUNCTION TEST
DL/VA % pred: 122 %
DL/VA: 5.13 ml/min/mmHg/L
DLCO cor % pred: 96 %
DLCO cor: 19.28 ml/min/mmHg
DLCO unc % pred: 96 %
DLCO unc: 19.28 ml/min/mmHg
FEF 25-75 Post: 1.53 L/s
FEF 25-75 Pre: 1.43 L/s
FEF2575-%Change-Post: 6 %
FEF2575-%Pred-Post: 67 %
FEF2575-%Pred-Pre: 63 %
FEV1-%Change-Post: 0 %
FEV1-%Pred-Post: 69 %
FEV1-%Pred-Pre: 69 %
FEV1-Post: 1.73 L
FEV1-Pre: 1.73 L
FEV1FVC-%Change-Post: 0 %
FEV1FVC-%Pred-Pre: 101 %
FEV6-%Change-Post: 0 %
FEV6-%Pred-Post: 70 %
FEV6-%Pred-Pre: 69 %
FEV6-Post: 2.2 L
FEV6-Pre: 2.19 L
FEV6FVC-%Pred-Post: 103 %
FEV6FVC-%Pred-Pre: 103 %
FVC-%Change-Post: 0 %
FVC-%Pred-Post: 67 %
FVC-%Pred-Pre: 67 %
FVC-Post: 2.2 L
FVC-Pre: 2.19 L
Post FEV1/FVC ratio: 79 %
Post FEV6/FVC ratio: 100 %
Pre FEV1/FVC ratio: 79 %
Pre FEV6/FVC Ratio: 100 %
RV % pred: 81 %
RV: 1.66 L
TLC % pred: 77 %
TLC: 3.9 L

## 2023-11-05 NOTE — Patient Instructions (Signed)
Full PFT performed today. °

## 2023-11-05 NOTE — Progress Notes (Signed)
Full PFT performed today. °

## 2023-12-10 ENCOUNTER — Ambulatory Visit (INDEPENDENT_AMBULATORY_CARE_PROVIDER_SITE_OTHER): Payer: 59 | Admitting: Pulmonary Disease

## 2023-12-10 ENCOUNTER — Encounter (HOSPITAL_BASED_OUTPATIENT_CLINIC_OR_DEPARTMENT_OTHER): Payer: Self-pay | Admitting: Pulmonary Disease

## 2023-12-10 VITALS — BP 118/72 | HR 67 | Resp 16 | Ht 64.0 in | Wt 143.2 lb

## 2023-12-10 DIAGNOSIS — D869 Sarcoidosis, unspecified: Secondary | ICD-10-CM

## 2023-12-10 NOTE — Patient Instructions (Signed)
Sarcoid Monitoring --Recent chest imaging reviewed.  --Annual PFTs.  Last PFTs 2024. Repeat in 1 year

## 2023-12-10 NOTE — Progress Notes (Signed)
Subjective:   PATIENT ID: Kristy Morse GENDER: female DOB: [birth year], MRN: [id number]  Chief Complaint  Patient presents with   Follow-up    FU PFT. Breathing has been good.     Reason for Visit: Follow-up  [name] is a 62 year old never smoker with sarcoid with sinus and skin involvement who presents for sarcoid follow-up  Initial consult She was diagnosed with sarcoid in 2000 that initially presented to lacrimation due to sarcoid involvement with her tear duct/sinuses s/p bilateral DCR. She is followed by [facility].  She is also followed by Dr. Kathi Ludwig with Rheumatologist for intermittent sarcoid flares. She is on Plaquenil. She was started on Hyrimoz last week for worsening skin lesions in the last 8 years. Previously on methotrexate however stopped due to GI upset and leukopenia. Dermatology is also involved and has her on tacrolimus and triamcinolone for her skin lesions. She is followed by Dr. Allyne Gee at [facility] for routine exam every 6 months - never was dx with ocular sarcoid.  12/10/23 Since our last visit she has been on Hyrimoz and doing well with her skin lesions. Denies shortness of breath, cough, wheezing. Denies recent infections. Has had CT scan in July and PFT in November.  Social History: Never smoker   Past Medical History:  Diagnosis Date   GERD (gastroesophageal reflux disease)    Sarcoidosis      Family History  Problem Relation Age of Onset   Colon cancer Father    Dementia Sister    Colon cancer Sister    Dementia Brother    Colon cancer Brother      Social History   Occupational History   Occupation: Admin work  Tobacco Use   Smoking status: Never   Smokeless tobacco: Never  Vaping Use   Vaping status: Never Used  Substance and Sexual Activity   Alcohol use: No   Drug use: No   Sexual activity: Not on file    Allergies  Allergen Reactions   Doxycycline Hives   Metronidazole Hives   Septra  [Sulfamethoxazole-Trimethoprim] Hives   Sulfa Antibiotics Hives     Outpatient Medications Prior to Visit  Medication Sig Dispense Refill   clobetasol (TEMOVATE) 0.05 % external solution Apply 1 Application topically daily as needed.     Fluocinolone Acetonide Scalp 0.01 % OIL 1 APPLICATION TO AFFECTED AREA TWICE A DAY MONDAY THRU FRIDAY ONLY for 30     fluticasone (FLONASE) 50 MCG/ACT nasal spray Place 2 sprays into both nostrils daily.  3   hydroxychloroquine (PLAQUENIL) 200 MG tablet Take 200 mg by mouth daily.     HYRIMOZ 40 MG/0.4ML SOAJ Inject into the skin.     ibandronate (BONIVA) 150 MG tablet Take 150 mg by mouth every 30 (thirty) days. Take in the morning with a full glass of water, on an empty stomach, and do not take anything else by mouth or lie down for the next 30 min.     pantoprazole (PROTONIX) 40 MG tablet Take 40 mg by mouth 2 (two) times daily before a meal.     sucralfate (CARAFATE) 1 g tablet SMARTSIG:1 Tablet(s) By Mouth 1-4 Times Daily     tacrolimus (PROTOPIC) 0.1 % ointment SMARTSIG:1 Topical Daily     valACYclovir (VALTREX) 500 MG tablet Take 500 mg by mouth 2 (two) times daily as needed.     No facility-administered medications prior to visit.    Review of Systems  Constitutional:  Negative  for chills, diaphoresis, fever, malaise/fatigue and weight loss.  HENT:  Negative for congestion.   Respiratory:  Negative for cough, hemoptysis, sputum production, shortness of breath and wheezing.   Cardiovascular:  Negative for chest pain, palpitations and leg swelling.     Objective:   Vitals:   12/10/23 1303  BP: 118/72  Pulse: 67  Resp: 16  SpO2: 99%  Weight: 143 lb 3.2 oz (65 kg)  Height: 5\' 4"  (1.626 m)   SpO2: 99 %  Physical Exam: General: Well-appearing, no acute distress HENT: [place], AT Eyes: EOMI, no scleral icterus Respiratory: Clear to auscultation bilaterally.  No crackles, wheezing or rales Cardiovascular: RRR, -M/R/G, no  JVD Extremities:-Edema,-tenderness Neuro: AAO x4, CNII-XII grossly intact Psych: Normal mood, normal affect Skin: Pitting areas with hyperpigmentation involving the right neck, left arm/elbo and right leg/knee. Resolved nasal lesion. Resolved inflammation per patient  Data Reviewed:  Imaging: CXR 05/14/23 - tiny right sided nodules likely related to sarcoid CT Chest 07/21/23 - Consistent with remote sarcoid with calcified mediastinal and hilar lymph nodes  PFT: 06/14/20 FVC 2.08 (77%) FEV1 1.74 (82%) Ratio 80  TLC 80% DLCO 99% Interpretation: No obstructive or restrictive defect present  11/05/23 FVC 2.20 (67%) FEV1 1.73 (69%) Ratio 79  TLC 77% DLCO 96% Interpretation: Moderate restrictive defect. Normal DLCO   Labs:    Latest Ref Rng & Units 01/30/2019    3:41 AM  CBC  WBC 4.0 - 10.5 K/uL 4.5   Hemoglobin 12.0 - 15.0 g/dL 29.5   Hematocrit 62.1 - 46.0 % 40.6   Platelets 150 - 400 K/uL 230       Latest Ref Rng & Units 01/30/2019    3:41 AM  CMP  Glucose 70 - 99 mg/dL 90   BUN 6 - 20 mg/dL 14   Creatinine 3.08 - 1.00 mg/dL 6.57   Sodium 846 - 962 mmol/L 139   Potassium 3.5 - 5.1 mmol/L 3.8   Chloride 98 - 111 mmol/L 105   CO2 22 - 32 mmol/L 25   Calcium 8.9 - 10.3 mg/dL 9.6   Total Protein 6.5 - 8.1 g/dL 8.1   Total Bilirubin 0.3 - 1.2 mg/dL 1.0   Alkaline Phos 38 - 126 U/L 52   AST 15 - 41 U/L 26   ALT 0 - 44 U/L 20         Assessment & Plan:   Discussion: 62 year old never smoker with sarcoid with sinus and skin involvement who presents follow-up sarcoid. Currently on immunosuppressants managed by Rheumatology. Improved skin lesions. Otherwise asymptomatic.  We discussed the clinical course of sarcoid and management including serial PFTs, labs, eye exam, and EKG and chest imaging if indicated. From a pulmonary standpoint will ensure lung function and imaging is stable during treatment  Sarcoid with skin involvement Chronic pansinusitis, nasal sarcoidosis Dx 2000  via nasal biopsy. Followed by ENT 2010 began developing skin lesions - progressive per patient Followed by Dermatology Followed by Rheumatology for immunosuppression management  Pulmonary sarcoidosis with restrictive defect --CT Chest reviewed from 06/2023. Prior granulomatous disease --Reviewed PFTs. Moderate restrictive defect. Will need surveillance for progression next year  History of immunosuppression High risk medication management Per Rheumatology  Sarcoid Monitoring --Recent chest imaging reviewed.  --Annual PFTs.  Last PFTs 2024. Repeat in 1 year --Annual ophthalmology exam.  --Recent EKG and TTE reviewed. No evidence of conduction abnormalities. --Routine labs as needed: CBC with diff, CMET, 1, 25 and 25 hydroxy vitamin D, urinary calcium  Health Maintenance Immunization History  Administered Date(s) Administered   Influenza,inj,Quad PF,6+ Mos 10/16/2021   PFIZER(Purple Top)SARS-COV-2 Vaccination 03/12/2020, 04/25/2020   Td 12/31/2007, 06/13/2017   Tdap 04/05/2008, 12/31/2011   CT Lung Screen - never smoker. Not qualified  No orders of the defined types were placed in this encounter. No orders of the defined types were placed in this encounter.   No follow-ups on file.  I have spent a total time of 50-minutes on the day of the appointment reviewing prior documentation, coordinating care and discussing medical diagnosis and plan with the patient/family. Imaging, labs and tests included in this note have been reviewed and interpreted independently by me.  Gareth Fitzner Mechele Collin, MD [facility] 12/10/2023 1:20 PM  Office Number [phone]

## 2023-12-22 ENCOUNTER — Ambulatory Visit (HOSPITAL_BASED_OUTPATIENT_CLINIC_OR_DEPARTMENT_OTHER): Payer: 59 | Admitting: Pulmonary Disease

## 2024-01-20 ENCOUNTER — Ambulatory Visit
Admission: RE | Admit: 2024-01-20 | Discharge: 2024-01-20 | Disposition: A | Discharge status: Home / Self Care | Payer: 59 | Source: Ambulatory Visit | Attending: Family Medicine | Admitting: Family Medicine

## 2024-01-20 DIAGNOSIS — Z0000 Encounter for general adult medical examination without abnormal findings: Secondary | ICD-10-CM

## 2024-05-12 ENCOUNTER — Other Ambulatory Visit: Payer: Self-pay | Admitting: Obstetrics

## 2024-05-12 DIAGNOSIS — Z1231 Encounter for screening mammogram for malignant neoplasm of breast: Secondary | ICD-10-CM

## 2024-11-18 ENCOUNTER — Ambulatory Visit (INDEPENDENT_AMBULATORY_CARE_PROVIDER_SITE_OTHER)

## 2024-11-18 DIAGNOSIS — D869 Sarcoidosis, unspecified: Secondary | ICD-10-CM

## 2024-11-18 LAB — PULMONARY FUNCTION TEST
DL/VA % pred: 120 %
DL/VA: 5.05 ml/min/mmHg/L
DLCO unc % pred: 95 %
DLCO unc: 19.06 ml/min/mmHg
FEF 25-75 Post: 1.44 L/s
FEF 25-75 Pre: 1.6 L/s
FEF2575-%Change-Post: -9 %
FEF2575-%Pred-Post: 65 %
FEF2575-%Pred-Pre: 72 %
FEV1-%Change-Post: -1 %
FEV1-%Pred-Post: 72 %
FEV1-%Pred-Pre: 73 %
FEV1-Post: 1.8 L
FEV1-Pre: 1.82 L
FEV1FVC-%Change-Post: 2 %
FEV1FVC-%Pred-Pre: 101 %
FEV6-%Change-Post: -3 %
FEV6-%Pred-Post: 72 %
FEV6-%Pred-Pre: 74 %
FEV6-Post: 2.25 L
FEV6-Pre: 2.32 L
FEV6FVC-%Pred-Post: 103 %
FEV6FVC-%Pred-Pre: 103 %
FVC-%Change-Post: -3 %
FVC-%Pred-Post: 69 %
FVC-%Pred-Pre: 71 %
FVC-Post: 2.25 L
FVC-Pre: 2.32 L
Post FEV1/FVC ratio: 80 %
Post FEV6/FVC ratio: 100 %
Pre FEV1/FVC ratio: 79 %
Pre FEV6/FVC Ratio: 100 %
RV % pred: 129 %
RV: 2.66 L
TLC % pred: 99 %
TLC: 5.02 L

## 2024-11-18 NOTE — Patient Instructions (Signed)
 Full PFT performed today.

## 2024-11-18 NOTE — Progress Notes (Signed)
 Full PFT performed today.

## 2024-11-30 ENCOUNTER — Ambulatory Visit: Admitting: Physical Therapy

## 2024-11-30 ENCOUNTER — Encounter: Payer: Self-pay | Admitting: Physical Therapy

## 2024-11-30 DIAGNOSIS — R2689 Other abnormalities of gait and mobility: Secondary | ICD-10-CM | POA: Insufficient documentation

## 2024-11-30 DIAGNOSIS — M6281 Muscle weakness (generalized): Secondary | ICD-10-CM | POA: Diagnosis present

## 2024-11-30 DIAGNOSIS — R279 Unspecified lack of coordination: Secondary | ICD-10-CM | POA: Insufficient documentation

## 2024-11-30 DIAGNOSIS — R2681 Unsteadiness on feet: Secondary | ICD-10-CM | POA: Insufficient documentation

## 2024-11-30 DIAGNOSIS — Z9181 History of falling: Secondary | ICD-10-CM | POA: Diagnosis present

## 2024-11-30 NOTE — Therapy (Signed)
 OUTPATIENT PHYSICAL THERAPY NEURO EVALUATION   Patient Name: Kristy Morse MRN: [id number] DOB:Aug 13, 1961, 63 y.o., female Today's Date: 11/30/2024   PCP: Hugh Charleston, MD REFERRING PROVIDER: Arnaldo Juliene RAMAN, MD  END OF SESSION:  PT End of Session - 11/30/24 0934     Visit Number 1    Number of Visits 13    Date for Recertification  01/18/25    Authorization Type [employer]    PT Start Time 0932    PT Stop Time 1016    PT Time Calculation (min) 44 min    Activity Tolerance Patient tolerated treatment well    Behavior During Therapy WFL for tasks assessed/performed          Past Medical History:  Diagnosis Date   GERD (gastroesophageal reflux disease)    Sarcoidosis    Past Surgical History:  Procedure Laterality Date   ABDOMINAL HYSTERECTOMY     Patient Active Problem List   Diagnosis Date Noted   SOB (shortness of breath) 08/07/2018   Sarcoidosis 05/27/2017    ONSET DATE: 11/12/2024 (referral)   REFERRING DIAG:    M25.551 (ICD-10-CM) - Pain in right hip    THERAPY DIAG:  Unsteadiness on feet  History of falling  Other abnormalities of gait and mobility  Unspecified lack of coordination  Muscle weakness (generalized)  Rationale for Evaluation and Treatment: Rehabilitation  SUBJECTIVE:                                                                                                                                                                                             SUBJECTIVE STATEMENT: Pt presents alone. States she is having some hip pain on the right side, but is mostly concerned about her balance. Feels unstable when she walks. Has had about 5 falls this year, most recent one was 3 weeks ago. Was leaning down to move her throw rug and lost her balance forward. Was able to get up on her own. Broke her wrists earlier this year when falling off a step stool. Tends to fall in forward direction.   Was getting PT at [facility] for her balance prior  to breaking her wrists. Has not resumed her exercises since then.    Pt accompanied by: self  PERTINENT HISTORY: GERD, sarcoidosis   PAIN:  Are you having pain? Has random occurrence of R hip pain, denied pain today.   PRECAUTIONS: Fall  RED FLAGS: None   WEIGHT BEARING RESTRICTIONS: No  FALLS: Has patient fallen in last 6 months? Yes. Number of falls 5  LIVING ENVIRONMENT: Lives with: lives alone Lives in: House/apartment Stairs: Yes:  External: 4 steps; none Has following equipment at home: None  PLOF: Independent  PATIENT GOALS: to get back to walk normal (as close as I can)   OBJECTIVE:  Note: Objective measures were completed at Evaluation unless otherwise noted.  DIAGNOSTIC FINDINGS: None relevant on file   COGNITION: Overall cognitive status: Within functional limits for tasks assessed   SENSATION: Pt denies numbness/tingling in BLEs  COORDINATION: Heel to shin test: WNL bilaterally  Finger to nose test: mild dysmetria noted bilaterally (L>R)  RAMS: WNL for BUE, Dysdiadochokinesia present in BLEs     POSTURE: No Significant postural limitations  LOWER EXTREMITY ROM:     Active  Right Eval Left Eval  Hip flexion    Hip extension    Hip abduction    Hip adduction    Hip internal rotation    Hip external rotation    Knee flexion    Knee extension    Ankle dorsiflexion    Ankle plantarflexion    Ankle inversion    Ankle eversion     (Blank rows = not tested)  LOWER EXTREMITY MMT:  Tested in seated position   MMT Right Eval Left Eval  Hip flexion 4 4-  Hip extension    Hip abduction 4+ 4+  Hip adduction 4+ 4+  Hip internal rotation    Hip external rotation    Knee flexion 4+ 4+  Knee extension 5 5  Ankle dorsiflexion 5 5  Ankle plantarflexion    Ankle inversion    Ankle eversion    (Blank rows = not tested)  BED MOBILITY:  Not tested Pt reports independence w/this   TRANSFERS: Sit to stand: Modified independence  Assistive  device utilized: None     Stand to sit: Modified independence  Assistive device utilized: None      RAMP:  Not tested  CURB:  Not tested  STAIRS: Findings: Comments: See FGA GAIT: Gait pattern: step through pattern, lateral hip instability, decreased trunk rotation, and wide BOS Distance walked: Various clinic distances  Assistive device utilized: None Level of assistance: Modified independence Comments: Inconsistent foot placement noted of LLE, worse in retro direction (FGA)    FUNCTIONAL TESTS:   [facility] PT Assessment - 11/30/24 0950       Balance   Balance Assessed Yes      Standardized Balance Assessment   Standardized Balance Assessment Timed Up and Go Test;Five Times Sit to Stand;10 meter walk test    Five times sit to stand comments  13.91s   No UE support, minor weight shift to L side   10 Meter Walk 1.08 m/s   85m over 9.25s no AD     Functional Gait  Assessment   Gait assessed  Yes    Gait Level Surface Walks 20 ft in less than 5.5 sec, no assistive devices, good speed, no evidence for imbalance, normal gait pattern, deviates no more than 6 in outside of the 12 in walkway width.   5.06s   Change in Gait Speed Able to smoothly change walking speed without loss of balance or gait deviation. Deviate no more than 6 in outside of the 12 in walkway width.    Gait with Horizontal Head Turns Performs head turns smoothly with no change in gait. Deviates no more than 6 in outside 12 in walkway width    Gait with Vertical Head Turns Performs head turns with no change in gait. Deviates no more than 6 in outside 12 in walkway width.  Gait and Pivot Turn Pivot turns safely within 3 sec and stops quickly with no loss of balance.    Step Over Obstacle Is able to step over one shoe box (4.5 in total height) but must slow down and adjust steps to clear box safely. May require verbal cueing.    Gait with Narrow Base of Support Ambulates less than 4 steps heel to toe or cannot perform  without assistance.    Gait with Eyes Closed Walks 20 ft, no assistive devices, good speed, no evidence of imbalance, normal gait pattern, deviates no more than 6 in outside 12 in walkway width. Ambulates 20 ft in less than 7 sec.   7.16s   Ambulating Backwards Walks 20 ft, slow speed, abnormal gait pattern, evidence for imbalance, deviates 10-15 in outside 12 in walkway width.   14.28s, very uncoordinated   Steps Alternating feet, must use rail.    Total Score 22    FGA comment: 22/30, medium fall risk           PATIENT SURVEYS:  ABC scale: The Activities-Specific Balance Confidence (ABC) Scale 0% 10 20 30  40 50 60 70 80 90 100% No confidence<->completely confident  "How confident are you that you will not lose your balance or become unsteady when you . . .   Date tested 11/30/24  Walk around the house 50%  2. Walk up or down stairs 70%  3. Bend over and pick up a slipper from in front of a closet floor 20%  4. Reach for a small can off a shelf at eye level 0%  5. Stand on tip toes and reach for something above your head 20%  6. Stand on a chair and reach for something 80%  7. Sweep the floor 10%  8. Walk outside the house to a car parked in the driveway 50%  9. Get into or out of a car 20%  10. Walk across a parking lot to the mall 60%  11. Walk up or down a ramp 40%  12. Walk in a crowded mall where people rapidly walk past you 20%  13. Are bumped into by people as you walk through the mall 40%  14. Step onto or off of an escalator while you are holding onto the railing 80%  15. Step onto or off an escalator while holding onto parcels such that you cannot hold onto the railing 40%  16. Walk outside on icy sidewalks 80%  Total: #/16 680/16 = 42.5%                                                                                                                                 TREATMENT:   Self-care/home management  Discussed results of OM and impaired coordination. Recommended  pt follow-up w/[facility] to inquire about neurology referral. Pt verbalized understanding  Pt inquiring about age and its effect on balance. Informed pt that her coordination deficits  are not necessarily related to age and continued to recommend consult w/neurologist.    PATIENT EDUCATION: Education details: POC, eval findings, encouragement to contact [facility] regarding neurology referral  Person educated: Patient Education method: Explanation Education comprehension: verbalized understanding and needs further education  HOME EXERCISE PROGRAM: To be established   GOALS: Goals reviewed with patient? Yes  SHORT TERM GOALS: Target date: 12/28/2024   Pt will be independent with initial HEP for improved strength, balance, transfers and gait.  Baseline: not established on eval  Goal status: INITIAL  2.  Pt will improve score on ABC Scale to >/= 55% for improved confidence in abilities and return to PLOF  Baseline: 42.5% (12/2) Goal status: INITIAL  3.  Pt will improve gait velocity to at least 1.15 m/s for improved gait efficiency and reduced fall risk   Baseline: 1.08 m/s  Goal status: INITIAL   LONG TERM GOALS: Target date: 01/11/2025   Pt will be independent with final HEP for improved strength, balance, transfers and gait.  Baseline:  Goal status: INITIAL  2.  Pt will improve score on ABC Scale to >/= 68% for improved confidence in abilities and return to PLOF  Baseline: 42.5% (12/2) Goal status: INITIAL  3.  Pt will improve FGA to 25/30 for decreased fall risk   Baseline: 22/30 (12/2) Goal status: INITIAL  4.  Pt will improve gait velocity to at least 1.3 m/s for improved gait efficiency and community ambulation   Baseline: 1.08 m/s  Goal status: INITIAL   ASSESSMENT:  CLINICAL IMPRESSION: Patient is a 63 year old female referred to Neuro OPPT for hip pain. Pt's PMH is significant for: GERD, sarcoidosis. The following deficits were present during the  exam: dysdiadochokinesia, impaired coordination of BLEs, impaired reactive balance and global deconditioning. Based on recent falls and FGA, pt is an incr risk for falls. Pt would benefit from skilled PT to address these impairments and functional limitations to maximize functional mobility independence.    OBJECTIVE IMPAIRMENTS: Abnormal gait, decreased activity tolerance, decreased balance, decreased coordination, decreased knowledge of condition, difficulty walking, decreased strength, impaired perceived functional ability, improper body mechanics, and pain  ACTIVITY LIMITATIONS: carrying, lifting, bending, squatting, stairs, transfers, reach over head, locomotion level, and caring for others  PARTICIPATION LIMITATIONS: driving, shopping, community activity, and yard work  PERSONAL FACTORS: Fitness, Past/current experiences, and frequent falls are also affecting patient's functional outcome.   REHAB POTENTIAL: Good  CLINICAL DECISION MAKING: Evolving/moderate complexity  EVALUATION COMPLEXITY: Moderate  PLAN:  PT FREQUENCY: 2x/week  PT DURATION: 6 weeks  PLANNED INTERVENTIONS: 97164- PT Re-evaluation, 97750- Physical Performance Testing, 97110-Therapeutic exercises, 97530- Therapeutic activity, [id number]- Neuromuscular re-education, 97535- Self Care, [id number]- Manual therapy, [phone]- Gait training, [phone]- Aquatic Therapy, [phone]- Electrical stimulation (manual), [phone] (1-2 muscles), [id number] (3+ muscles)- Dry Needling, Patient/Family education, Balance training, Stair training, Joint mobilization, Spinal mobilization, Vestibular training, and DME instructions  PLAN FOR NEXT SESSION: coordination tasks w/BLEs (Blaze pods on stairs, banded gait), retro gait, tandem stance, functional BLE strength    Ruhaan Nordahl E Shelbia Scinto, PT, DPT 11/30/2024, 12:35 PM

## 2024-12-06 ENCOUNTER — Ambulatory Visit: Admitting: Physical Therapy

## 2024-12-06 NOTE — Progress Notes (Deleted)
 NEUROLOGY FOLLOW UP OFFICE NOTE  Kristy Morse [id number]  Assessment/Plan:   Balance disorder - recurrence only once she started exercising.  Does not exhibit signs or symptoms of myelopathy, lumbar spinal stenosis/radiculopathy, polyneuropathy, myopathy or objective muscle weakness.  As symptoms are subtle, any objective finding may be missed.  And since symptoms have recurred, will check MRI of brain.  If unrevealing, likely related to age and deconditioning as she had not been that physically active.  In which case, she should find improvement with continuation of exercise. Further recommendations pending results.  07/22/2023 ADDENDUM:  MRI of brain on 07/18/2023 was unremarkable without explanation for balance disorder or concerning findings.  Neurologic exam is unremarkable.  No evidence of muscle weakness or neuropathy on exam/testing.  Suspect deconditioning.  Continue exercise and follow up with PCP.     Subjective:  Kristy Morse is a 63 year old female with Sjogren's syndrome and sarcoidosis who follows up for balance disorder and lower extremity weakness.  UPDATE:   HISTORY: In September 2022, she noticed that when she would first stand up from a chair, she felt herself leaning towards the left.  It was brief.  She felt fine when walking.  No falls.  She denies feeling dizzy, either spinning or feeling like she will pass out.  No associated lower extremity weakness.  Denies numbness in the legs and feet.  No aura fullness, hearing loss or double vision.  She reports that her lifestyle was sedentary.  She began walking more and by January 2023, symptoms resolved.  She started exercising in early 2024.  She noticed that when she would side step during exercise, or while walking, she felt herself leaning to the left again.  Her legs felt slower or incoordinated.  Occurs only while in that activity.  No weakness or numbness.  She did have left knee pain for which she went to PT, but  otherwise no lower extremity pain, back pain or neck pain.  No symptoms involving the upper extremities.  MRI of brain without contrast on 07/10/2023 showed mild chronic small vessel ischemic changes but overall unremarkable.  She has continued to experience bilateral lower extremity weakness and progressive gait instability despite conservative management.  More recently she has noted some bilateral hip pain and has been referred back to physical therapy.    She has pulmonary sarcoidosis. She also has Sjogren's for which she takes Plaquenil. She is followed by rheumatology.   10/26/2021 LABS:Sed rate 38, CRP 3.50 04/10/2021 LABS: ACE 66     PAST MEDICAL HISTORY: Past Medical History:  Diagnosis Date   GERD (gastroesophageal reflux disease)    Sarcoidosis     MEDICATIONS: Current Outpatient Medications on File Prior to Visit  Medication Sig Dispense Refill   clobetasol (TEMOVATE) 0.05 % external solution Apply 1 Application topically daily as needed.     Fluocinolone Acetonide Scalp 0.01 % OIL 1 APPLICATION TO AFFECTED AREA TWICE A DAY MONDAY THRU FRIDAY ONLY for 30     fluticasone (FLONASE) 50 MCG/ACT nasal spray Place 2 sprays into both nostrils daily.  3   hydroxychloroquine (PLAQUENIL) 200 MG tablet Take 200 mg by mouth daily.     HYRIMOZ 40 MG/0.4ML SOAJ Inject into the skin.     ibandronate (BONIVA) 150 MG tablet Take 150 mg by mouth every 30 (thirty) days. Take in the morning with a full glass of water, on an empty stomach, and do not take anything else by mouth or lie  down for the next 30 min.     pantoprazole (PROTONIX) 40 MG tablet Take 40 mg by mouth 2 (two) times daily before a meal.     sucralfate (CARAFATE) 1 g tablet SMARTSIG:1 Tablet(s) By Mouth 1-4 Times Daily     tacrolimus (PROTOPIC) 0.1 % ointment SMARTSIG:1 Topical Daily     valACYclovir (VALTREX) 500 MG tablet Take 500 mg by mouth 2 (two) times daily as needed.     No current facility-administered medications on  file prior to visit.    ALLERGIES: Allergies  Allergen Reactions   Doxycycline Hives   Metronidazole Hives   Septra [Sulfamethoxazole-Trimethoprim] Hives   Sulfa Antibiotics Hives    FAMILY HISTORY: Family History  Problem Relation Age of Onset   Colon cancer Father    Dementia Sister    Colon cancer Sister    Dementia Brother    Colon cancer Brother       Objective:  Blood pressure (!) 144/76, pulse 71, height 5' 4 (1.626 m), weight 139 lb (63 kg), SpO2 98 %. General: No acute distress.  Patient appears well-groomed.   Head:  Normocephalic/atraumatic Eyes:  Fundi examined but not visualized Neck: supple, no paraspinal tenderness, full range of motion Heart:  Regular rate and rhythm Lungs:  Clear to auscultation bilaterally Back: No paraspinal tenderness Neurological Exam: alert and oriented.  Speech fluent and not dysarthric, language intact.  CN II-XII intact. Bulk and tone normal, muscle strength 5/5 throughout.  Sensation to light touch intact.  Deep tendon reflexes 2+ throughout, toes downgoing.  Finger to nose and heel to shin testing intact.  Gait normal, Romberg negative.   Juliene Dunnings, DO

## 2024-12-07 ENCOUNTER — Ambulatory Visit (INDEPENDENT_AMBULATORY_CARE_PROVIDER_SITE_OTHER): Admitting: Pulmonary Disease

## 2024-12-07 ENCOUNTER — Encounter (HOSPITAL_BASED_OUTPATIENT_CLINIC_OR_DEPARTMENT_OTHER): Payer: Self-pay | Admitting: Pulmonary Disease

## 2024-12-07 ENCOUNTER — Ambulatory Visit: Admitting: Neurology

## 2024-12-07 VITALS — BP 139/83 | HR 70 | Ht 64.0 in | Wt 144.0 lb

## 2024-12-07 DIAGNOSIS — D869 Sarcoidosis, unspecified: Secondary | ICD-10-CM

## 2024-12-07 NOTE — Patient Instructions (Addendum)
  Pulmonary sarcoidosis with restrictive defect --CT Chest reviewed from 06/2023. Prior granulomatous disease. ORDER CT scan in Nov 2026 --Reviewed PFTs today. Resolved restriction. Next PFT 2027 --Continue Hyrimoz per Rheumatology

## 2024-12-07 NOTE — Progress Notes (Signed)
 Subjective:   PATIENT ID: Kristy Morse GENDER: female DOB: December 21, 1961, MRN: [id number]  Chief Complaint  Patient presents with   Sarcoidosis    Reason for Visit: Follow-up  [name] is a 63 year old never smoker with sarcoid with sinus and skin involvement who presents for sarcoid follow-up  Initial consult She was diagnosed with sarcoid in 2000 that initially presented to lacrimation due to sarcoid involvement with her tear duct/sinuses s/p bilateral DCR. She is followed by [facility].  She is also followed by Dr. Leni with Rheumatologist for intermittent sarcoid flares. She is on Plaquenil. She was started on Hyrimoz last week for worsening skin lesions in the last 8 years. Previously on methotrexate however stopped due to GI upset and leukopenia. Dermatology is also involved and has her on tacrolimus and triamcinolone  for her skin lesions. She is followed by Dr. Jarold at [facility] for routine exam every 6 months - never was dx with ocular sarcoid.  12/10/23 Since our last visit she has been on Hyrimoz and doing well with her skin lesions. Denies shortness of breath, cough, wheezing. Denies recent infections. Has had CT scan in July and PFT in November.  12/07/24 Since our last visit she is overall doing well with well controlled skin issues on Hyrimoz. Managed by Rheumatology. Completed PFTs  Social History: Never smoker   Past Medical History:  Diagnosis Date   GERD (gastroesophageal reflux disease)    Sarcoidosis      Family History  Problem Relation Age of Onset   Colon cancer Father    Dementia Sister    Colon cancer Sister    Dementia Brother    Colon cancer Brother      Social History   Occupational History   Occupation: Admin work  Tobacco Use   Smoking status: Never   Smokeless tobacco: Never  Vaping Use   Vaping status: Never Used  Substance and Sexual Activity   Alcohol use: No   Drug use: No   Sexual activity: Not on file     Allergies  Allergen Reactions   Doxycycline Hives   Metronidazole Hives   Septra [Sulfamethoxazole-Trimethoprim] Hives   Sulfa Antibiotics Hives     Outpatient Medications Prior to Visit  Medication Sig Dispense Refill   clobetasol (TEMOVATE) 0.05 % external solution Apply 1 Application topically daily as needed.     Fluocinolone Acetonide Scalp 0.01 % OIL 1 APPLICATION TO AFFECTED AREA TWICE A DAY MONDAY THRU FRIDAY ONLY for 30     fluticasone (FLONASE) 50 MCG/ACT nasal spray Place 2 sprays into both nostrils daily.  3   hydroxychloroquine (PLAQUENIL) 200 MG tablet Take 200 mg by mouth daily.     HYRIMOZ 40 MG/0.4ML SOAJ Inject into the skin.     ibandronate (BONIVA) 150 MG tablet Take 150 mg by mouth every 30 (thirty) days. Take in the morning with a full glass of water, on an empty stomach, and do not take anything else by mouth or lie down for the next 30 min.     pantoprazole (PROTONIX) 40 MG tablet Take 40 mg by mouth 2 (two) times daily before a meal.     sucralfate (CARAFATE) 1 g tablet SMARTSIG:1 Tablet(s) By Mouth 1-4 Times Daily     tacrolimus (PROTOPIC) 0.1 % ointment SMARTSIG:1 Topical Daily     valACYclovir (VALTREX) 500 MG tablet Take 500 mg by mouth 2 (two) times daily as needed.     No facility-administered medications  prior to visit.    Review of Systems  Constitutional:  Negative for chills, diaphoresis, fever, malaise/fatigue and weight loss.  HENT:  Negative for congestion.   Respiratory:  Negative for cough, hemoptysis, sputum production, shortness of breath and wheezing.   Cardiovascular:  Negative for chest pain, palpitations and leg swelling.     Objective:   Vitals:   12/07/24 1401  BP: 139/83  Pulse: 70  SpO2: 99%  Weight: 144 lb (65.3 kg)  Height: 5' 4 (1.626 m)   SpO2: 99 %  Physical Exam: General: Well-appearing, no acute distress HENT: [place], AT Eyes: EOMI, no scleral icterus Respiratory: Clear to auscultation bilaterally.  No  crackles, wheezing or rales Cardiovascular: RRR, -M/R/G, no JVD Extremities:-Edema,-tenderness Neuro: AAO x4, CNII-XII grossly intact Psych: Normal mood, normal affect Skin: Areas of hyperpigmentation in right neck and right knee that has remained. Resolved nasal scar  Data Reviewed:  Imaging: CXR 05/14/23 - tiny right sided nodules likely related to sarcoid CT Chest 07/21/23 - Consistent with remote sarcoid with calcified mediastinal and hilar lymph nodes  PFT: 06/14/20 FVC 2.08 (77%) FEV1 1.74 (82%) Ratio 80  TLC 80% DLCO 99% Interpretation: No obstructive or restrictive defect present  11/05/23 FVC 2.20 (67%) FEV1 1.73 (69%) Ratio 79  TLC 77% DLCO 96% Interpretation: Moderate restrictive defect. Normal DLCO  12/07/24 FVC 2.25 (69%) FEV1 1.80 (72%) Ratio 80  TLC 99% DLCO 95% Interpretation: Restriction resolved. Normal PFTs    Labs:    Latest Ref Rng & Units 01/30/2019    3:41 AM  CBC  WBC 4.0 - 10.5 K/uL 4.5   Hemoglobin 12.0 - 15.0 g/dL 86.6   Hematocrit 63.9 - 46.0 % 40.6   Platelets 150 - 400 K/uL 230       Latest Ref Rng & Units 01/30/2019    3:41 AM  CMP  Glucose 70 - 99 mg/dL 90   BUN 6 - 20 mg/dL 14   Creatinine 9.55 - 1.00 mg/dL 9.07   Sodium 864 - 854 mmol/L 139   Potassium 3.5 - 5.1 mmol/L 3.8   Chloride 98 - 111 mmol/L 105   CO2 22 - 32 mmol/L 25   Calcium 8.9 - 10.3 mg/dL 9.6   Total Protein 6.5 - 8.1 g/dL 8.1   Total Bilirubin 0.3 - 1.2 mg/dL 1.0   Alkaline Phos 38 - 126 U/L 52   AST 15 - 41 U/L 26   ALT 0 - 44 U/L 20         Assessment & Plan:   Discussion: 62 year old never smoker with sarcoid with sinus and skin involvement who presents follow-up sarcoid. Currently on immunosuppressants managed by Rheumatology. Improved skin lesions on immunosuppressants. Otherwise asymptomatic.  We discussed the clinical course of sarcoid and management including serial PFTs, labs, eye exam, and EKG and chest imaging if indicated. From a pulmonary standpoint  will ensure lung function and imaging is stable during treatment. PFTs reviewed and resolved restriction.  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 Followed by ENT 2010 began developing skin lesions - progressive per patient Followed by Dermatology. Seen as needed. Last visit 09/2024 Followed by Rheumatology for immunosuppression management  Pulmonary sarcoidosis with restrictive defect --CT Chest reviewed from 06/2023. Prior granulomatous disease. ORDER CT scan in Nov 2026 --Reviewed PFTs today. Resolved restriction. Next PFT 2027  History of immunosuppression High risk medication management Continue Hyrimoz per Rheumatology  Sarcoid Monitoring --Recent chest imaging reviewed.  --Annual  PFTs.  Last PFTs 2025 --Annual ophthalmology exam. Scheduled for 12/2024 --Recent EKG and TTE reviewed. No evidence of conduction abnormalities. --Routine labs as needed: CBC with diff, CMET, 1, 25 and 25 hydroxy vitamin D, urinary calcium  Health Maintenance Immunization History  Administered Date(s) Administered   Influenza,inj,Quad PF,6+ Mos 10/16/2021   PFIZER(Purple Top)SARS-COV-2 Vaccination 03/12/2020, 04/25/2020   Td 12/31/2007, 06/13/2017   Tdap 04/05/2008, 12/31/2011   CT Lung Screen - never smoker. Not qualified  Orders Placed This Encounter  Procedures   CT Chest Wo Contrast    Standing Status:   Future    Expiration Date:   12/07/2025    Scheduling Instructions:     Schedule 10/2025 and ensure pulmonary follow-up after (Dr. Kassie)    Preferred imaging location?:   [facility]  No orders of the defined types were placed in this encounter.   Return in about 1 year (around 12/07/2025).  I have spent a total time of 31-minutes on the day of the appointment including chart review, data review, collecting history, coordinating care and discussing medical diagnosis and plan with the patient/family. Past medical history,  allergies, medications were reviewed. Pertinent imaging, labs and tests included in this note have been reviewed and interpreted independently by me.  Ulrick Methot Slater Kassie, MD [facility] 12/07/2024 2:55 PM

## 2024-12-09 NOTE — Progress Notes (Unsigned)
 NEUROLOGY FOLLOW UP OFFICE NOTE  Kristy Morse [id number]  Assessment/Plan:   Balance disorder - recurrence only once she started exercising.  Does not exhibit signs or symptoms of myelopathy, lumbar spinal stenosis/radiculopathy, polyneuropathy, myopathy or objective muscle weakness.  As symptoms are subtle, any objective finding may be missed.  And since symptoms have recurred, will check MRI of brain.  If unrevealing, likely related to age and deconditioning as she had not been that physically active.  In which case, she should find improvement with continuation of exercise. Further recommendations pending results.  07/22/2023 ADDENDUM:  MRI of brain on 07/18/2023 was unremarkable without explanation for balance disorder or concerning findings.  Neurologic exam is unremarkable.  No evidence of muscle weakness or neuropathy on exam/testing.  Suspect deconditioning.  Continue exercise and follow up with PCP.     Subjective:  Kristy Morse is a 63 year old female with Sjogren's syndrome and sarcoidosis who follows up for balance disorder and lower extremity weakness.  UPDATE:   HISTORY: In September 2022, she noticed that when she would first stand up from a chair, she felt herself leaning towards the left.  It was brief.  She felt fine when walking.  No falls.  She denies feeling dizzy, either spinning or feeling like she will pass out.  No associated lower extremity weakness.  Denies numbness in the legs and feet.  No aura fullness, hearing loss or double vision.  She reports that her lifestyle was sedentary.  She began walking more and by January 2023, symptoms resolved.  She started exercising in early 2024.  She noticed that when she would side step during exercise, or while walking, she felt herself leaning to the left again.  Her legs felt slower or incoordinated.  Occurs only while in that activity.  No weakness or numbness.  She did have left knee pain for which she went to PT, but  otherwise no lower extremity pain, back pain or neck pain.  No symptoms involving the upper extremities.  MRI of brain without contrast on 07/10/2023 showed mild chronic small vessel ischemic changes but overall unremarkable.  She has continued to experience bilateral lower extremity weakness and progressive gait instability despite conservative management.  More recently she has noted some bilateral hip pain and has been referred back to physical therapy.    She has pulmonary sarcoidosis. She also has Sjogren's for which she takes Plaquenil. She is followed by rheumatology.   10/26/2021 LABS:Sed rate 38, CRP 3.50 04/10/2021 LABS: ACE 66     PAST MEDICAL HISTORY: Past Medical History:  Diagnosis Date   GERD (gastroesophageal reflux disease)    Sarcoidosis     MEDICATIONS: Current Outpatient Medications on File Prior to Visit  Medication Sig Dispense Refill   clobetasol (TEMOVATE) 0.05 % external solution Apply 1 Application topically daily as needed.     Fluocinolone Acetonide Scalp 0.01 % OIL 1 APPLICATION TO AFFECTED AREA TWICE A DAY MONDAY THRU FRIDAY ONLY for 30     fluticasone (FLONASE) 50 MCG/ACT nasal spray Place 2 sprays into both nostrils daily.  3   hydroxychloroquine (PLAQUENIL) 200 MG tablet Take 200 mg by mouth daily.     HYRIMOZ 40 MG/0.4ML SOAJ Inject into the skin.     ibandronate (BONIVA) 150 MG tablet Take 150 mg by mouth every 30 (thirty) days. Take in the morning with a full glass of water, on an empty stomach, and do not take anything else by mouth or lie  down for the next 30 min.     pantoprazole (PROTONIX) 40 MG tablet Take 40 mg by mouth 2 (two) times daily before a meal.     sucralfate (CARAFATE) 1 g tablet SMARTSIG:1 Tablet(s) By Mouth 1-4 Times Daily     tacrolimus (PROTOPIC) 0.1 % ointment SMARTSIG:1 Topical Daily     valACYclovir (VALTREX) 500 MG tablet Take 500 mg by mouth 2 (two) times daily as needed.     No current facility-administered medications on  file prior to visit.    ALLERGIES: Allergies  Allergen Reactions   Doxycycline Hives   Metronidazole Hives   Septra [Sulfamethoxazole-Trimethoprim] Hives   Sulfa Antibiotics Hives    FAMILY HISTORY: Family History  Problem Relation Age of Onset   Colon cancer Father    Dementia Sister    Colon cancer Sister    Dementia Brother    Colon cancer Brother       Objective:  Blood pressure (!) 144/76, pulse 71, height 5' 4 (1.626 m), weight 139 lb (63 kg), SpO2 98 %. General: No acute distress.  Patient appears well-groomed.   Head:  Normocephalic/atraumatic Eyes:  Fundi examined but not visualized Neck: supple, no paraspinal tenderness, full range of motion Heart:  Regular rate and rhythm Lungs:  Clear to auscultation bilaterally Back: No paraspinal tenderness Neurological Exam: alert and oriented.  Speech fluent and not dysarthric, language intact.  CN II-XII intact. Bulk and tone normal, muscle strength 5/5 throughout.  Sensation to light touch intact.  Deep tendon reflexes 2+ throughout, toes downgoing.  Finger to nose and heel to shin testing intact.  Gait normal, Romberg negative.   Juliene Dunnings, DO

## 2024-12-10 ENCOUNTER — Other Ambulatory Visit

## 2024-12-10 ENCOUNTER — Encounter: Payer: Self-pay | Admitting: Neurology

## 2024-12-10 ENCOUNTER — Ambulatory Visit: Admitting: Physical Therapy

## 2024-12-10 ENCOUNTER — Ambulatory Visit: Admitting: Neurology

## 2024-12-10 VITALS — BP 149/86 | Ht 64.0 in | Wt 145.0 lb

## 2024-12-10 VITALS — BP 154/90 | HR 70

## 2024-12-10 DIAGNOSIS — R2681 Unsteadiness on feet: Secondary | ICD-10-CM | POA: Diagnosis not present

## 2024-12-10 DIAGNOSIS — R2689 Other abnormalities of gait and mobility: Secondary | ICD-10-CM | POA: Diagnosis not present

## 2024-12-10 DIAGNOSIS — R29898 Other symptoms and signs involving the musculoskeletal system: Secondary | ICD-10-CM | POA: Diagnosis not present

## 2024-12-10 DIAGNOSIS — R279 Unspecified lack of coordination: Secondary | ICD-10-CM

## 2024-12-10 DIAGNOSIS — M6281 Muscle weakness (generalized): Secondary | ICD-10-CM

## 2024-12-10 DIAGNOSIS — Z9181 History of falling: Secondary | ICD-10-CM

## 2024-12-10 NOTE — Therapy (Signed)
 OUTPATIENT PHYSICAL THERAPY NEURO TREATMENT   Patient Name: Kristy Morse MRN: [id number] DOB:Apr 17, 1961, 63 y.o., female Today's Date: 12/10/2024   PCP: Hugh Charleston, MD REFERRING PROVIDER: Arnaldo Juliene RAMAN, MD  END OF SESSION:  PT End of Session - 12/10/24 1530     Visit Number 2    Number of Visits 13    Date for Recertification  01/18/25    Authorization Type [employer]    PT Start Time 1530    PT Stop Time 1612    PT Time Calculation (min) 42 min    Equipment Utilized During Treatment Gait belt    Activity Tolerance Patient tolerated treatment well    Behavior During Therapy WFL for tasks assessed/performed          Past Medical History:  Diagnosis Date   GERD (gastroesophageal reflux disease)    Sarcoidosis    Past Surgical History:  Procedure Laterality Date   ABDOMINAL HYSTERECTOMY     Patient Active Problem List   Diagnosis Date Noted   SOB (shortness of breath) 08/07/2018   Sarcoidosis 05/27/2017    ONSET DATE: 11/12/2024 (referral)   REFERRING DIAG:    M25.551 (ICD-10-CM) - Pain in right hip    THERAPY DIAG:  Unsteadiness on feet  History of falling  Other abnormalities of gait and mobility  Unspecified lack of coordination  Muscle weakness (generalized)  Rationale for Evaluation and Treatment: Rehabilitation  SUBJECTIVE:                                                                                                                                                                                             SUBJECTIVE STATEMENT: Pt reports doing well. Just saw Dr. Skeet and is going to have a NCV w/EMG. No falls or pain to report. Did not sleep well last night.    Pt accompanied by: self  PERTINENT HISTORY: GERD, sarcoidosis   PAIN:  Are you having pain? Has random occurrence of R hip pain, denied pain today.   PRECAUTIONS: Fall  RED FLAGS: None   WEIGHT BEARING RESTRICTIONS: No  FALLS: Has patient fallen in last 6 months? Yes.  Number of falls 5  LIVING ENVIRONMENT: Lives with: lives alone Lives in: House/apartment Stairs: Yes: External: 4 steps; none Has following equipment at home: None  PLOF: Independent  PATIENT GOALS: to get back to walk normal (as close as I can)   OBJECTIVE:  Note: Objective measures were completed at Evaluation unless otherwise noted.  DIAGNOSTIC FINDINGS: None relevant on file   COGNITION: Overall cognitive status: Within functional limits for tasks assessed   SENSATION: Pt denies  numbness/tingling in BLEs  COORDINATION: Heel to shin test: WNL bilaterally  Finger to nose test: mild dysmetria noted bilaterally (L>R)  RAMS: WNL for BUE, Dysdiadochokinesia present in BLEs     POSTURE: No Significant postural limitations  LOWER EXTREMITY ROM:     Active  Right Eval Left Eval  Hip flexion    Hip extension    Hip abduction    Hip adduction    Hip internal rotation    Hip external rotation    Knee flexion    Knee extension    Ankle dorsiflexion    Ankle plantarflexion    Ankle inversion    Ankle eversion     (Blank rows = not tested)  LOWER EXTREMITY MMT:  Tested in seated position   MMT Right Eval Left Eval  Hip flexion 4 4-  Hip extension    Hip abduction 4+ 4+  Hip adduction 4+ 4+  Hip internal rotation    Hip external rotation    Knee flexion 4+ 4+  Knee extension 5 5  Ankle dorsiflexion 5 5  Ankle plantarflexion    Ankle inversion    Ankle eversion    (Blank rows = not tested)  BED MOBILITY:  Not tested Pt reports independence w/this   TRANSFERS: Sit to stand: Modified independence  Assistive device utilized: None     Stand to sit: Modified independence  Assistive device utilized: None      RAMP:  Not tested  CURB:  Not tested  STAIRS: Findings: Comments: See FGA GAIT: Gait pattern: step through pattern, lateral hip instability, decreased trunk rotation, and wide BOS Distance walked: Various clinic distances  Assistive device  utilized: None Level of assistance: Modified independence Comments: Inconsistent foot placement noted of LLE    FUNCTIONAL TESTS:      PATIENT SURVEYS:  ABC scale: The Activities-Specific Balance Confidence (ABC) Scale 0% 10 20 30  40 50 60 70 80 90 100% No confidence<->completely confident  How confident are you that you will not lose your balance or become unsteady when you . . .   Date tested 11/30/24  Walk around the house 50%  2. Walk up or down stairs 70%  3. Bend over and pick up a slipper from in front of a closet floor 20%  4. Reach for a small can off a shelf at eye level 0%  5. Stand on tip toes and reach for something above your head 20%  6. Stand on a chair and reach for something 80%  7. Sweep the floor 10%  8. Walk outside the house to a car parked in the driveway 50%  9. Get into or out of a car 20%  10. Walk across a parking lot to the mall 60%  11. Walk up or down a ramp 40%  12. Walk in a crowded mall where people rapidly walk past you 20%  13. Are bumped into by people as you walk through the mall 40%  14. Step onto or off of an escalator while you are holding onto the railing 80%  15. Step onto or off an escalator while holding onto parcels such that you cannot hold onto the railing 40%  16. Walk outside on icy sidewalks 80%  Total: #/16 680/16 = 42.5%     VITALS  Vitals:   12/10/24 1533  BP: (!) 154/90  Pulse: 70  TREATMENT:   Self-care/home management  Assessed vitals in LUE (see above) and pt reports it is more elevated than her normal but she did not sleep well last night. Pt reports she will continue to monitor this at home.   Ther Act/NMR  SciFit multi-peaks level 7.5 for 8 minutes using BUE/BLEs for neural priming for reciprocal movement, dynamic cardiovascular warmup and increased amplitude of stepping.  Established  initial HEP for improved retro stepping, stability w/narrow BOS, single leg stability and vestibular input:  Fwd/retro gait at ballet bar, x4 reps each direction. Min cues for LARGE retro steps and pt frequently losing balance posteriorly initially but w/practice, was able to stabilize independently.  In corner:  Tandem stance, x2 minutes per side. Increased difficulty w/LLE posterior and pt unable to hold position >10s  Romberg stance w/EC, x1 minute Progressed to horizontal and vertical head turns, x30s each direction. Increased difficulty w/vertical turns  Staggered stance sit to stands w/3# weights, x10 reps per side. Min cues for proper foot placement. Pt initially lost balance posteriorly, but was able to self-correct balance w/additional reps.  On Rockerboard in A/P direction:  Standing w/EO, x3 minutes w/intermittent UE support. Mod multimodal cues to facilitate ankle strategy. Pt also demonstrated premature righting reactions throughout  Progressed to alt retro step off board, x15 reps per side. Pt required CGA and mod multimodal cues to slow down and focus on proper foot placement w/activity. Intermittent UE support required  Alt fwd step off board, x12 reps per side. Increased stability w/this direction.    PATIENT EDUCATION: Education details: Initial HEP  Person educated: Patient Education method: Explanation, Demonstration, Tactile cues, Verbal cues, and Handouts Education comprehension: verbalized understanding, returned demonstration, verbal cues required, tactile cues required, and needs further education  HOME EXERCISE PROGRAM: Access Code: [email or web address] URL: [email or web address] Date: 12/10/2024 Prepared by: Marlon Lars Jeziorski  Exercises - Backward Walking with Counter Support  - 1 x daily - 7 x weekly - 3-5 sets - Tandem Stance in Corner  - 1 x daily - 7 x weekly - 3 sets - 30-45 seconds hold - Corner Balance Feet Together: Eyes Closed With Head Turns  - 1 x  daily - 7 x weekly - 2-3 sets - 30-45 seconds hold - Staggered Sit-to-Stand  - 1 x daily - 7 x weekly - 2-3 sets - 10 reps  GOALS: Goals reviewed with patient? Yes  SHORT TERM GOALS: Target date: 12/28/2024   Pt will be independent with initial HEP for improved strength, balance, transfers and gait.  Baseline: not established on eval  Goal status: INITIAL  2.  Pt will improve score on ABC Scale to >/= 55% for improved confidence in abilities and return to PLOF  Baseline: 42.5% (12/2) Goal status: INITIAL  3.  Pt will improve gait velocity to at least 1.15 m/s for improved gait efficiency and reduced fall risk   Baseline: 1.08 m/s  Goal status: INITIAL   LONG TERM GOALS: Target date: 01/11/2025   Pt will be independent with final HEP for improved strength, balance, transfers and gait.  Baseline:  Goal status: INITIAL  2.  Pt will improve score on ABC Scale to >/= 68% for improved confidence in abilities and return to PLOF  Baseline: 42.5% (12/2) Goal status: INITIAL  3.  Pt will improve FGA to 25/30 for decreased fall risk   Baseline: 22/30 (12/2) Goal status: INITIAL  4.  Pt will improve gait velocity to at least 1.3 m/s for improved gait efficiency and  community ambulation   Baseline: 1.08 m/s  Goal status: INITIAL   ASSESSMENT:  CLINICAL IMPRESSION: Emphasis of skilled PT session on establishing initial HEP, facilitation of ankle strategy and functional BLE strength. Pt demonstrates impaired ankle strategy and premature righting reactions, resulting in instability especially in retro direction. Cued pt to slow down and focus on foot placement and pt able to stabilize independently and began to facilitate ankle strategy, but will benefit from further practice on this in PT. Continue POC.    OBJECTIVE IMPAIRMENTS: Abnormal gait, decreased activity tolerance, decreased balance, decreased coordination, decreased knowledge of condition, difficulty walking, decreased  strength, impaired perceived functional ability, improper body mechanics, and pain  ACTIVITY LIMITATIONS: carrying, lifting, bending, squatting, stairs, transfers, reach over head, locomotion level, and caring for others  PARTICIPATION LIMITATIONS: driving, shopping, community activity, and yard work  PERSONAL FACTORS: Fitness, Past/current experiences, and frequent falls are also affecting patient's functional outcome.   REHAB POTENTIAL: Good  CLINICAL DECISION MAKING: Evolving/moderate complexity  EVALUATION COMPLEXITY: Moderate  PLAN:  PT FREQUENCY: 2x/week  PT DURATION: 6 weeks  PLANNED INTERVENTIONS: [id number]- PT Re-evaluation, 97750- Physical Performance Testing, 97110-Therapeutic exercises, 97530- Therapeutic activity, [id number]- Neuromuscular re-education, 97535- Self Care, [id number]- Manual therapy, [phone]- Gait training, [phone]- Aquatic Therapy, [phone]- Electrical stimulation (manual), [phone] (1-2 muscles), [id number] (3+ muscles)- Dry Needling, Patient/Family education, Balance training, Stair training, Joint mobilization, Spinal mobilization, Vestibular training, and DME instructions  PLAN FOR NEXT SESSION: coordination tasks w/BLEs (Blaze pods on stairs, banded gait), retro gait, tandem stance, functional BLE strength, ankle strategy    Marlon BRAVO Briggs Edelen, PT, DPT 12/10/2024, 4:13 PM

## 2024-12-10 NOTE — Patient Instructions (Addendum)
 Check CK and aldolase. Your provider has requested that you have labwork completed today. Please go to [facility] ([phone]) on the second floor of this building before leaving the office today. You do not need to check in. If you are not called within 15 minutes please check with the front desk.   Check nerve study of legs. ELECTROMYOGRAM AND NERVE CONDUCTION STUDIES (EMG/NCS) INSTRUCTIONS  How to Prepare The neurologist conducting the EMG will need to know if you have certain medical conditions. Tell the neurologist and other EMG lab personnel if you: Have a pacemaker or any other electrical medical device Take blood-thinning medications Have hemophilia, a blood-clotting disorder that causes prolonged bleeding Bathing Take a shower or bath shortly before your exam in order to remove oils from your skin. Dont apply lotions or creams before the exam.  What to Expect Youll likely be asked to change into a hospital gown for the procedure and lie down on an examination table. The following explanations can help you understand what will happen during the exam.  Electrodes. The neurologist or a technician places surface electrodes at various locations on your skin depending on where youre experiencing symptoms. Or the neurologist may insert needle electrodes at different sites depending on your symptoms.  Sensations. The electrodes will at times transmit a tiny electrical current that you may feel as a twinge or spasm. The needle electrode may cause discomfort or pain that usually ends shortly after the needle is removed. If you are concerned about discomfort or pain, you may want to talk to the neurologist about taking a short break during the exam.  Instructions. During the needle EMG, the neurologist will assess whether there is any spontaneous electrical activity when the muscle is at rest - activity that isnt present in healthy muscle tissue - and the degree of activity when you  slightly contract the muscle.  He or she will give you instructions on resting and contracting a muscle at appropriate times. Depending on what muscles and nerves the neurologist is examining, he or she may ask you to change positions during the exam.  After your EMG You may experience some temporary, minor bruising where the needle electrode was inserted into your muscle. This bruising should fade within several days. If it persists, contact your primary care doctor.   Further recommendations pending results.

## 2024-12-13 ENCOUNTER — Encounter (HOSPITAL_BASED_OUTPATIENT_CLINIC_OR_DEPARTMENT_OTHER): Payer: Self-pay | Admitting: Pulmonary Disease

## 2024-12-14 ENCOUNTER — Ambulatory Visit: Admitting: Physical Therapy

## 2024-12-14 VITALS — BP 148/84 | HR 66

## 2024-12-14 DIAGNOSIS — R279 Unspecified lack of coordination: Secondary | ICD-10-CM

## 2024-12-14 DIAGNOSIS — Z9181 History of falling: Secondary | ICD-10-CM

## 2024-12-14 DIAGNOSIS — R2681 Unsteadiness on feet: Secondary | ICD-10-CM

## 2024-12-14 DIAGNOSIS — R2689 Other abnormalities of gait and mobility: Secondary | ICD-10-CM

## 2024-12-14 LAB — ALDOLASE: Aldolase: 17.4 U/L — ABNORMAL HIGH (ref <=8.1)

## 2024-12-14 LAB — CK: Total CK: 1849 U/L — ABNORMAL HIGH (ref 20–243)

## 2024-12-14 NOTE — Therapy (Signed)
 OUTPATIENT PHYSICAL THERAPY NEURO TREATMENT   Patient Name: Kristy Morse MRN: [id number] DOB:November 10, 1961, 63 y.o., female Today's Date: 12/14/2024   PCP: Hugh Charleston, MD REFERRING PROVIDER: Arnaldo Juliene RAMAN, MD  END OF SESSION:  PT End of Session - 12/14/24 1020     Visit Number 3    Number of Visits 13    Date for Recertification  01/18/25    Authorization Type U[employer]    PT Start Time 1019    PT Stop Time 1100    PT Time Calculation (min) 41 min    Equipment Utilized During Treatment Gait belt    Activity Tolerance Patient tolerated treatment well    Behavior During Therapy WFL for tasks assessed/performed           Past Medical History:  Diagnosis Date   GERD (gastroesophageal reflux disease)    Sarcoidosis    Past Surgical History:  Procedure Laterality Date   ABDOMINAL HYSTERECTOMY     Patient Active Problem List   Diagnosis Date Noted   SOB (shortness of breath) 08/07/2018   Sarcoidosis 05/27/2017    ONSET DATE: 11/12/2024 (referral)   REFERRING DIAG:    M25.551 (ICD-10-CM) - Pain in right hip    THERAPY DIAG:  Unsteadiness on feet  History of falling  Other abnormalities of gait and mobility  Unspecified lack of coordination  Rationale for Evaluation and Treatment: Rehabilitation  SUBJECTIVE:                                                                                                                                                                                             SUBJECTIVE STATEMENT: Pt reports doing well. Denies falls or acute changes. Has been working on her HEP, balance in the corner is still challenging.    Pt accompanied by: self  PERTINENT HISTORY: GERD, sarcoidosis   PAIN:  Are you having pain? Has random occurrence of R hip pain, denied pain today.   PRECAUTIONS: Fall  RED FLAGS: None   WEIGHT BEARING RESTRICTIONS: No  FALLS: Has patient fallen in last 6 months? Yes. Number of falls 5  LIVING  ENVIRONMENT: Lives with: lives alone Lives in: House/apartment Stairs: Yes: External: 4 steps; none Has following equipment at home: None  PLOF: Independent  PATIENT GOALS: to get back to walk normal (as close as I can)   OBJECTIVE:  Note: Objective measures were completed at Evaluation unless otherwise noted.  DIAGNOSTIC FINDINGS: None relevant on file   COGNITION: Overall cognitive status: Within functional limits for tasks assessed   SENSATION: Pt denies numbness/tingling in BLEs  COORDINATION: Heel to shin test:  WNL bilaterally  Finger to nose test: mild dysmetria noted bilaterally (L>R)  RAMS: WNL for BUE, Dysdiadochokinesia present in BLEs     POSTURE: No Significant postural limitations  LOWER EXTREMITY ROM:     Active  Right Eval Left Eval  Hip flexion    Hip extension    Hip abduction    Hip adduction    Hip internal rotation    Hip external rotation    Knee flexion    Knee extension    Ankle dorsiflexion    Ankle plantarflexion    Ankle inversion    Ankle eversion     (Blank rows = not tested)  LOWER EXTREMITY MMT:  Tested in seated position   MMT Right Eval Left Eval  Hip flexion 4 4-  Hip extension    Hip abduction 4+ 4+  Hip adduction 4+ 4+  Hip internal rotation    Hip external rotation    Knee flexion 4+ 4+  Knee extension 5 5  Ankle dorsiflexion 5 5  Ankle plantarflexion    Ankle inversion    Ankle eversion    (Blank rows = not tested)  BED MOBILITY:  Not tested Pt reports independence w/this   TRANSFERS: Sit to stand: Modified independence  Assistive device utilized: None     Stand to sit: Modified independence  Assistive device utilized: None      RAMP:  Not tested  CURB:  Not tested  STAIRS: Findings: Comments: See FGA GAIT: Gait pattern: step through pattern, lateral hip instability, decreased trunk rotation, and wide BOS Distance walked: Various clinic distances  Assistive device utilized: None Level of  assistance: Modified independence Comments: Inconsistent foot placement noted of LLE    FUNCTIONAL TESTS:      PATIENT SURVEYS:  ABC scale: The Activities-Specific Balance Confidence (ABC) Scale 0% 10 20 30  40 50 60 70 80 90 100% No confidence<->completely confident  How confident are you that you will not lose your balance or become unsteady when you . . .   Date tested 11/30/24  Walk around the house 50%  2. Walk up or down stairs 70%  3. Bend over and pick up a slipper from in front of a closet floor 20%  4. Reach for a small can off a shelf at eye level 0%  5. Stand on tip toes and reach for something above your head 20%  6. Stand on a chair and reach for something 80%  7. Sweep the floor 10%  8. Walk outside the house to a car parked in the driveway 50%  9. Get into or out of a car 20%  10. Walk across a parking lot to the mall 60%  11. Walk up or down a ramp 40%  12. Walk in a crowded mall where people rapidly walk past you 20%  13. Are bumped into by people as you walk through the mall 40%  14. Step onto or off of an escalator while you are holding onto the railing 80%  15. Step onto or off an escalator while holding onto parcels such that you cannot hold onto the railing 40%  16. Walk outside on icy sidewalks 80%  Total: #/16 680/16 = 42.5%     VITALS  Vitals:   12/14/24 1022  BP: (!) 148/84  Pulse: 66  TREATMENT:   Self-care/home management  Assessed vitals in LUE (see above) and WNL  Ther Act/NMR  SciFit multi-peaks level 7.5 for 8 minutes using BUE/BLEs for neural priming for reciprocal movement, dynamic cardiovascular warmup and increased amplitude of stepping.  The following were performed for improved ankle strategy, LE coordination, single leg stability and vestibular input in // bars:  On Airex:  Romberg stance w/EO, x2  minutes   Romberg stance w/horizontal and vertical head turns, x1 minute each Single cone taps > double cone taps w/intermittent UE support, x20 reps per leg  On blue balance beam Side stepping x3 reps each direction w/intermittent UE support. Added 3 6 hurdles to step over, x3 reps w/intermittent UE support  Add 4 gumdrops to tap in between hurdles, x3 reps. Pt heavily reliant on BUE support to perform  On Rockerboard in A/P direction (per pt request):  Alt retro step off board, x15 reps per side. Intermittent UE support require.  Alt fwd step off board, x12 reps per side. Increased stability w/this direction.    PATIENT EDUCATION: Education details: Continue HEP  Person educated: Patient Education method: Explanation, Demonstration, Tactile cues, and Verbal cues Education comprehension: verbalized understanding, returned demonstration, verbal cues required, tactile cues required, and needs further education  HOME EXERCISE PROGRAM: Access Code: [email or web address] URL: [email or web address] Date: 12/10/2024 Prepared by: Marlon Beldon Nowling  Exercises - Backward Walking with Counter Support  - 1 x daily - 7 x weekly - 3-5 sets - Tandem Stance in Corner  - 1 x daily - 7 x weekly - 3 sets - 30-45 seconds hold - Corner Balance Feet Together: Eyes Closed With Head Turns  - 1 x daily - 7 x weekly - 2-3 sets - 30-45 seconds hold - Staggered Sit-to-Stand  - 1 x daily - 7 x weekly - 2-3 sets - 10 reps  GOALS: Goals reviewed with patient? Yes  SHORT TERM GOALS: Target date: 12/28/2024   Pt will be independent with initial HEP for improved strength, balance, transfers and gait.  Baseline: not established on eval  Goal status: INITIAL  2.  Pt will improve score on ABC Scale to >/= 55% for improved confidence in abilities and return to PLOF  Baseline: 42.5% (12/2) Goal status: INITIAL  3.  Pt will improve gait velocity to at least 1.15 m/s for improved gait efficiency and reduced fall  risk   Baseline: 1.08 m/s  Goal status: INITIAL   LONG TERM GOALS: Target date: 01/11/2025   Pt will be independent with final HEP for improved strength, balance, transfers and gait.  Baseline:  Goal status: INITIAL  2.  Pt will improve score on ABC Scale to >/= 68% for improved confidence in abilities and return to PLOF  Baseline: 42.5% (12/2) Goal status: INITIAL  3.  Pt will improve FGA to 25/30 for decreased fall risk   Baseline: 22/30 (12/2) Goal status: INITIAL  4.  Pt will improve gait velocity to at least 1.3 m/s for improved gait efficiency and community ambulation   Baseline: 1.08 m/s  Goal status: INITIAL   ASSESSMENT:  CLINICAL IMPRESSION: Emphasis of skilled PT session on improved ankle strategy, single leg stability, LE coordination and vestibular input. Pt demonstrated improved facilitation of ankle strategy on rockerboard today, likely due to priming movement on airex prior to using board. Pt continues to rely heavily on BUE support to correct balance despite max cues to utilize hip/ankle strategy. Will continue to work on facilitation of ankle/hip strategy for balance to  reduce fall frequency and improve balance strategies. Continue POC.    OBJECTIVE IMPAIRMENTS: Abnormal gait, decreased activity tolerance, decreased balance, decreased coordination, decreased knowledge of condition, difficulty walking, decreased strength, impaired perceived functional ability, improper body mechanics, and pain  ACTIVITY LIMITATIONS: carrying, lifting, bending, squatting, stairs, transfers, reach over head, locomotion level, and caring for others  PARTICIPATION LIMITATIONS: driving, shopping, community activity, and yard work  PERSONAL FACTORS: Fitness, Past/current experiences, and frequent falls are also affecting patient's functional outcome.   REHAB POTENTIAL: Good  CLINICAL DECISION MAKING: Evolving/moderate complexity  EVALUATION COMPLEXITY: Moderate  PLAN:  PT  FREQUENCY: 2x/week  PT DURATION: 6 weeks  PLANNED INTERVENTIONS: 97164- PT Re-evaluation, 97750- Physical Performance Testing, 97110-Therapeutic exercises, 97530- Therapeutic activity, [id number]- Neuromuscular re-education, 97535- Self Care, 02859- Manual therapy, [phone]- Gait training, [id number]- Aquatic Therapy, [phone]- Electrical stimulation (manual), [phone] (1-2 muscles), 20561 (3+ muscles)- Dry Needling, Patient/Family education, Balance training, Stair training, Joint mobilization, Spinal mobilization, Vestibular training, and DME instructions  PLAN FOR NEXT SESSION: coordination tasks w/BLEs (Blaze pods on stairs, banded gait), retro gait, tandem stance, functional BLE strength, ankle strategy    Skiler Olden E Allyah Heather, PT, DPT 12/14/2024, 11:00 AM

## 2024-12-15 ENCOUNTER — Ambulatory Visit: Payer: Self-pay | Admitting: Neurology

## 2024-12-16 ENCOUNTER — Ambulatory Visit: Admitting: Physical Therapy

## 2024-12-17 ENCOUNTER — Ambulatory Visit: Admitting: Physical Therapy

## 2024-12-21 ENCOUNTER — Ambulatory Visit: Admitting: Physical Therapy

## 2024-12-21 VITALS — BP 142/79 | HR 82

## 2024-12-21 DIAGNOSIS — M6281 Muscle weakness (generalized): Secondary | ICD-10-CM

## 2024-12-21 DIAGNOSIS — Z9181 History of falling: Secondary | ICD-10-CM

## 2024-12-21 DIAGNOSIS — R279 Unspecified lack of coordination: Secondary | ICD-10-CM

## 2024-12-21 DIAGNOSIS — R2689 Other abnormalities of gait and mobility: Secondary | ICD-10-CM

## 2024-12-21 DIAGNOSIS — R2681 Unsteadiness on feet: Secondary | ICD-10-CM | POA: Diagnosis not present

## 2024-12-21 NOTE — Therapy (Signed)
 " OUTPATIENT PHYSICAL THERAPY NEURO TREATMENT   Patient Name: Kristy Morse MRN: [id number] DOB:[birth year], 62 y.o., female Today's Date: 12/21/2024   PCP: Hugh Charleston, MD REFERRING PROVIDER: Arnaldo Juliene RAMAN, MD  END OF SESSION:  PT End of Session - 12/21/24 1019     Visit Number 4    Number of Visits 13    Date for Recertification  01/18/25    Authorization Type UHC    PT Start Time 1018    PT Stop Time 1059    PT Time Calculation (min) 41 min    Equipment Utilized During Treatment Gait belt    Activity Tolerance Patient tolerated treatment well    Behavior During Therapy WFL for tasks assessed/performed           Past Medical History:  Diagnosis Date   GERD (gastroesophageal reflux disease)    Sarcoidosis    Past Surgical History:  Procedure Laterality Date   ABDOMINAL HYSTERECTOMY     Patient Active Problem List   Diagnosis Date Noted   SOB (shortness of breath) 08/07/2018   Sarcoidosis 05/27/2017    ONSET DATE: 11/12/2024 (referral)   REFERRING DIAG:    M25.551 (ICD-10-CM) - Pain in right hip    THERAPY DIAG:  Unsteadiness on feet  History of falling  Other abnormalities of gait and mobility  Unspecified lack of coordination  Muscle weakness (generalized)  Rationale for Evaluation and Treatment: Rehabilitation  SUBJECTIVE:                                                                                                                                                                                             SUBJECTIVE STATEMENT: Pt reports doing well. Denies falls or acute changes. Has been working on her HEP, balance in the corner is still challenging. Wearing her R wrist brace today due to some wrist discomfort but denies pain    Pt accompanied by: self  PERTINENT HISTORY: GERD, sarcoidosis   PAIN:  Are you having pain? Has random occurrence of R hip pain, denied pain today.   PRECAUTIONS: Fall  RED FLAGS: None   WEIGHT  BEARING RESTRICTIONS: No  FALLS: Has patient fallen in last 6 months? Yes. Number of falls 5  LIVING ENVIRONMENT: Lives with: lives alone Lives in: House/apartment Stairs: Yes: External: 4 steps; none Has following equipment at home: None  PLOF: Independent  PATIENT GOALS: to get back to walk normal (as close as I can)   OBJECTIVE:  Note: Objective measures were completed at Evaluation unless otherwise noted.  DIAGNOSTIC FINDINGS: None relevant on file   COGNITION: Overall cognitive status: Within  functional limits for tasks assessed   SENSATION: Pt denies numbness/tingling in BLEs  COORDINATION: Heel to shin test: WNL bilaterally  Finger to nose test: mild dysmetria noted bilaterally (L>R)  RAMS: WNL for BUE, Dysdiadochokinesia present in BLEs     POSTURE: No Significant postural limitations  LOWER EXTREMITY ROM:     Active  Right Eval Left Eval  Hip flexion    Hip extension    Hip abduction    Hip adduction    Hip internal rotation    Hip external rotation    Knee flexion    Knee extension    Ankle dorsiflexion    Ankle plantarflexion    Ankle inversion    Ankle eversion     (Blank rows = not tested)  LOWER EXTREMITY MMT:  Tested in seated position   MMT Right Eval Left Eval  Hip flexion 4 4-  Hip extension    Hip abduction 4+ 4+  Hip adduction 4+ 4+  Hip internal rotation    Hip external rotation    Knee flexion 4+ 4+  Knee extension 5 5  Ankle dorsiflexion 5 5  Ankle plantarflexion    Ankle inversion    Ankle eversion    (Blank rows = not tested)  BED MOBILITY:  Not tested Pt reports independence w/this   TRANSFERS: Sit to stand: Modified independence  Assistive device utilized: None     Stand to sit: Modified independence  Assistive device utilized: None      RAMP:  Not tested  CURB:  Not tested  STAIRS: Findings: Comments: See FGA GAIT: Gait pattern: step through pattern, lateral hip instability, decreased trunk rotation,  and wide BOS Distance walked: Various clinic distances  Assistive device utilized: None Level of assistance: Modified independence Comments: Inconsistent foot placement noted of LLE    FUNCTIONAL TESTS:      PATIENT SURVEYS:  ABC scale: The Activities-Specific Balance Confidence (ABC) Scale 0% 10 20 30  40 50 60 70 80 90 100% No confidence<->completely confident  How confident are you that you will not lose your balance or become unsteady when you . . .   Date tested 11/30/24  Walk around the house 50%  2. Walk up or down stairs 70%  3. Bend over and pick up a slipper from in front of a closet floor 20%  4. Reach for a small can off a shelf at eye level 0%  5. Stand on tip toes and reach for something above your head 20%  6. Stand on a chair and reach for something 80%  7. Sweep the floor 10%  8. Walk outside the house to a car parked in the driveway 50%  9. Get into or out of a car 20%  10. Walk across a parking lot to the mall 60%  11. Walk up or down a ramp 40%  12. Walk in a crowded mall where people rapidly walk past you 20%  13. Are bumped into by people as you walk through the mall 40%  14. Step onto or off of an escalator while you are holding onto the railing 80%  15. Step onto or off an escalator while holding onto parcels such that you cannot hold onto the railing 40%  16. Walk outside on icy sidewalks 80%  Total: #/16 680/16 = 42.5%     VITALS  Vitals:   12/21/24 1020  BP: (!) 142/79  Pulse: 82  TREATMENT:   Self-care/home management  Assessed vitals in LUE (see above) and WNL  Ther Act/Ex SciFit multi-peaks level 7.5 for 8 minutes using BUE/BLEs for neural priming for reciprocal movement, dynamic cardiovascular warmup and increased amplitude of stepping. RPE of 5/10 following activity.  Reviewed HEP per pt request: With back  to corner and a chair in front of pt for safety:  Tandem stance, x45s per side. Increased difficulty holding if LLE posterior  Romberg stance w/vertical and horizontal head turns, x30s per direction. Increased difficulty w/horizontal turns, but noted improved ankle strategy this date.  The following were performed for proximal stability, core stability, eccentric quad strength and single leg stability:  On mat, half kneel OH press w/6# DB, x6 reps. Pt unable to stabilize well, so regressed to pallof press instead of OH press w/6# DB, x6 reps per side. Min A required for stability and increased instability noted on RLE  Suitcase carries w/12# KB anchored to red resistance band, x115' per side. Increased difficulty holding on L side  From 6 step:  Alt fwd eccentric heel taps w/BUE support, x10 reps per side. Mod multimodal cues for improved eccentric control. Increased difficulty stabilizing on L side   Lateral eccentric heel taps w/BUE support, x10 reps per side. Continued difficulty w/control on LLE  Unable to add to HEP as pt does not have safe steps at home  Double leg presses, x25 reps at 50#. Mod multimodal cues to avoid locking out knees and for slow movement, but pt continued to lock out knees and demonstrate poor eccentric control.    STAIRS:  Level of Assistance: SBA  Stair Negotiation Technique: Alternating Pattern  with No Rails Bilateral Rails  Number of Stairs: 36   Height of Stairs: 6  Comments: Had pt carry 12# KB up/down 8 steps per UE and noted significant lateral instability to R side. Regressed to ascending/descending steps w/no weight and pt continues to drift to R side w/poor eccentric control. Pt states that she has to be careful navigating her steps at home because she has no rail and has to perform sideways step-to pattern to prevent a fall.    PATIENT EDUCATION: Education details: Continue HEP  Person educated: Patient Education method: Explanation, Demonstration,  Tactile cues, and Verbal cues Education comprehension: verbalized understanding, returned demonstration, verbal cues required, tactile cues required, and needs further education  HOME EXERCISE PROGRAM: Access Code: [email or web address] URL: [email or web address] Date: 12/10/2024 Prepared by: Marlon Stacie Templin  Exercises - Backward Walking with Counter Support  - 1 x daily - 7 x weekly - 3-5 sets - Tandem Stance in Corner  - 1 x daily - 7 x weekly - 3 sets - 30-45 seconds hold - Corner Balance Feet Together: Eyes Closed With Head Turns  - 1 x daily - 7 x weekly - 2-3 sets - 30-45 seconds hold - Staggered Sit-to-Stand  - 1 x daily - 7 x weekly - 2-3 sets - 10 reps  GOALS: Goals reviewed with patient? Yes  SHORT TERM GOALS: Target date: 12/28/2024   Pt will be independent with initial HEP for improved strength, balance, transfers and gait.  Baseline: not established on eval  Goal status: INITIAL  2.  Pt will improve score on ABC Scale to >/= 55% for improved confidence in abilities and return to PLOF  Baseline: 42.5% (12/2) Goal status: INITIAL  3.  Pt will improve gait velocity to at least 1.15 m/s for improved gait efficiency and reduced fall risk   Baseline:  1.08 m/s  Goal status: INITIAL   LONG TERM GOALS: Target date: 01/11/2025   Pt will be independent with final HEP for improved strength, balance, transfers and gait.  Baseline:  Goal status: INITIAL  2.  Pt will improve score on ABC Scale to >/= 68% for improved confidence in abilities and return to PLOF  Baseline: 42.5% (12/2) Goal status: INITIAL  3.  Pt will improve FGA to 25/30 for decreased fall risk   Baseline: 22/30 (12/2) Goal status: INITIAL  4.  Pt will improve gait velocity to at least 1.3 m/s for improved gait efficiency and community ambulation   Baseline: 1.08 m/s  Goal status: INITIAL   ASSESSMENT:  CLINICAL IMPRESSION: Emphasis of skilled PT session on functional BLE strength, eccentric  control and proximal stability. Pt requested to review HEP at start of session and noted improved facilitation of ankle strategy compared to previous sessions. Pt demonstrates significant lateral instability when carrying objects in single hand and on steps, so will continue to address functional hip strength deficits for reduced fall risk. Continue POC.    OBJECTIVE IMPAIRMENTS: Abnormal gait, decreased activity tolerance, decreased balance, decreased coordination, decreased knowledge of condition, difficulty walking, decreased strength, impaired perceived functional ability, improper body mechanics, and pain  ACTIVITY LIMITATIONS: carrying, lifting, bending, squatting, stairs, transfers, reach over head, locomotion level, and caring for others  PARTICIPATION LIMITATIONS: driving, shopping, community activity, and yard work  PERSONAL FACTORS: Fitness, Past/current experiences, and frequent falls are also affecting patient's functional outcome.   REHAB POTENTIAL: Good  CLINICAL DECISION MAKING: Evolving/moderate complexity  EVALUATION COMPLEXITY: Moderate  PLAN:  PT FREQUENCY: 2x/week  PT DURATION: 6 weeks  PLANNED INTERVENTIONS: 97164- PT Re-evaluation, 97750- Physical Performance Testing, 97110-Therapeutic exercises, 97530- Therapeutic activity, [id number]- Neuromuscular re-education, 97535- Self Care, 02859- Manual therapy, [id number]- Gait training, [phone]- Aquatic Therapy, [phone]- Electrical stimulation (manual), [phone] (1-2 muscles), 20561 (3+ muscles)- Dry Needling, Patient/Family education, Balance training, Stair training, Joint mobilization, Spinal mobilization, Vestibular training, and DME instructions  PLAN FOR NEXT SESSION: Goals. Add appointments. coordination tasks w/BLEs (Blaze pods on stairs, banded gait), retro gait, tandem stance, functional BLE strength, ankle strategy    Cedrica Brune E Adrijana Haros, PT, DPT 12/21/2024, 11:06 AM        "

## 2024-12-27 NOTE — Telephone Encounter (Signed)
 Pt. Wants result call

## 2024-12-28 ENCOUNTER — Ambulatory Visit: Admitting: Physical Therapy

## 2024-12-31 ENCOUNTER — Ambulatory Visit: Attending: Sports Medicine

## 2024-12-31 DIAGNOSIS — Z9181 History of falling: Secondary | ICD-10-CM | POA: Insufficient documentation

## 2024-12-31 DIAGNOSIS — R2681 Unsteadiness on feet: Secondary | ICD-10-CM | POA: Insufficient documentation

## 2024-12-31 DIAGNOSIS — R2689 Other abnormalities of gait and mobility: Secondary | ICD-10-CM | POA: Insufficient documentation

## 2024-12-31 DIAGNOSIS — M6281 Muscle weakness (generalized): Secondary | ICD-10-CM | POA: Insufficient documentation

## 2024-12-31 DIAGNOSIS — R279 Unspecified lack of coordination: Secondary | ICD-10-CM | POA: Insufficient documentation

## 2024-12-31 NOTE — Progress Notes (Signed)
 LMOVM for patient to call the office.

## 2024-12-31 NOTE — Therapy (Signed)
 " OUTPATIENT PHYSICAL THERAPY NEURO TREATMENT   Patient Name: Kristy Morse MRN: [id number] DOB:October 03, 1961, 64 y.o., female Today's Date: 12/31/2024   PCP: Hugh Charleston, MD REFERRING PROVIDER: Arnaldo Juliene RAMAN, MD  END OF SESSION:  PT End of Session - 12/31/24 1026     Visit Number 5    Number of Visits 13    Date for Recertification  01/18/25    Authorization Type [employer]    PT Start Time 1025    PT Stop Time 1105    PT Time Calculation (min) 40 min    Equipment Utilized During Treatment Gait belt    Activity Tolerance Patient tolerated treatment well    Behavior During Therapy WFL for tasks assessed/performed            Past Medical History:  Diagnosis Date   GERD (gastroesophageal reflux disease)    Sarcoidosis    Past Surgical History:  Procedure Laterality Date   ABDOMINAL HYSTERECTOMY     Patient Active Problem List   Diagnosis Date Noted   SOB (shortness of breath) 08/07/2018   Sarcoidosis 05/27/2017    ONSET DATE: 11/12/2024 (referral)   REFERRING DIAG:    M25.551 (ICD-10-CM) - Pain in right hip    THERAPY DIAG:  Unsteadiness on feet  History of falling  Other abnormalities of gait and mobility  Rationale for Evaluation and Treatment: Rehabilitation  SUBJECTIVE:                                                                                                                                                                                             SUBJECTIVE STATEMENT: Pt reports doing well. No falls since PT started. Pt reports hip pain is not boethering her but it is instability that is bother her. Pt reports her falls are because she trips over threshold or uneven things on floor and she is unable to catch herself. Pt reports that this happens once a week.   Pt accompanied by: self  PERTINENT HISTORY: GERD, sarcoidosis   PAIN:  Are you having pain? Has random occurrence of R hip pain, denied pain today.   PRECAUTIONS: Fall  RED  FLAGS: None   WEIGHT BEARING RESTRICTIONS: No  FALLS: Has patient fallen in last 6 months? Yes. Number of falls 5  LIVING ENVIRONMENT: Lives with: lives alone Lives in: House/apartment Stairs: Yes: External: 4 steps; none Has following equipment at home: None  PLOF: Independent  PATIENT GOALS: to get back to walk normal (as close as I can)   OBJECTIVE:  Note: Objective measures were completed at Evaluation unless otherwise noted.  DIAGNOSTIC FINDINGS: None  relevant on file   COGNITION: Overall cognitive status: Within functional limits for tasks assessed   SENSATION: Pt denies numbness/tingling in BLEs  COORDINATION: Heel to shin test: WNL bilaterally  Finger to nose test: mild dysmetria noted bilaterally (L>R)  RAMS: WNL for BUE, Dysdiadochokinesia present in BLEs     POSTURE: No Significant postural limitations  LOWER EXTREMITY ROM:     Active  Right Eval Left Eval  Hip flexion    Hip extension    Hip abduction    Hip adduction    Hip internal rotation    Hip external rotation    Knee flexion    Knee extension    Ankle dorsiflexion    Ankle plantarflexion    Ankle inversion    Ankle eversion     (Blank rows = not tested)  LOWER EXTREMITY MMT:  Tested in seated position   MMT Right Eval Left Eval  Hip flexion 4 4-  Hip extension    Hip abduction 4+ 4+  Hip adduction 4+ 4+  Hip internal rotation    Hip external rotation    Knee flexion 4+ 4+  Knee extension 5 5  Ankle dorsiflexion 5 5  Ankle plantarflexion    Ankle inversion    Ankle eversion    (Blank rows = not tested)  BED MOBILITY:  Not tested Pt reports independence w/this   TRANSFERS: Sit to stand: Modified independence  Assistive device utilized: None     Stand to sit: Modified independence  Assistive device utilized: None      RAMP:  Not tested  CURB:  Not tested  STAIRS: Findings: Comments: See FGA GAIT: Gait pattern: step through pattern, lateral hip instability,  decreased trunk rotation, and wide BOS Distance walked: Various clinic distances  Assistive device utilized: None Level of assistance: Modified independence Comments: Inconsistent foot placement noted of LLE    FUNCTIONAL TESTS:      PATIENT SURVEYS:  ABC scale: The Activities-Specific Balance Confidence (ABC) Scale 0% 10 20 30  40 50 60 70 80 90 100% No confidence<->completely confident  How confident are you that you will not lose your balance or become unsteady when you . . .   Date tested 11/30/24 12/31/24  Walk around the house 50% 80%  2. Walk up or down stairs 70% 50%  3. Bend over and pick up a slipper from in front of a closet floor 20% 60%  4. Reach for a small can off a shelf at eye level 0% 100%  5. Stand on tip toes and reach for something above your head 20% 90%  6. Stand on a chair and reach for something 80% 50%  7. Sweep the floor 10% 100%  8. Walk outside the house to a car parked in the driveway 50% 80%  9. Get into or out of a car 20% 100%  10. Walk across a parking lot to the mall 60% 90%  11. Walk up or down a ramp 40% 80%  12. Walk in a crowded mall where people rapidly walk past you 20% 70%  13. Are bumped into by people as you walk through the mall 40% 70%  14. Step onto or off of an escalator while you are holding onto the railing 80% 50%  15. Step onto or off an escalator while holding onto parcels such that you cannot hold onto the railing 40% 50%  16. Walk outside on icy sidewalks 80% 50  Total: #/16 680/16 = 42.5% 73.75%  VITALS  There were no vitals filed for this visit.                                                                                                                                 TREATMENT:   Reviewed patient's history and falls. Asked details of the falls. Assessed: ankle dorsiflexors: 5/5 bil Ankle strategy: 10x anterior and 10x posterior Step strategy: 5x anterio rand 5x posterior Reviewed details of ABC scale with  patient and things we can work on in PT.  Pt educated to talk to landlord to install rails in the front steps to improve her safety with negotiating stairs.    PATIENT EDUCATION: Education details: Continue HEP  Person educated: Patient Education method: Explanation, Demonstration, Tactile cues, and Verbal cues Education comprehension: verbalized understanding, returned demonstration, verbal cues required, tactile cues required, and needs further education  HOME EXERCISE PROGRAM: Access Code: [email or web address] URL: [email or web address] Date: 12/10/2024 Prepared by: Marlon Plaster  Exercises - Backward Walking with Counter Support  - 1 x daily - 7 x weekly - 3-5 sets - Tandem Stance in Corner  - 1 x daily - 7 x weekly - 3 sets - 30-45 seconds hold - Corner Balance Feet Together: Eyes Closed With Head Turns  - 1 x daily - 7 x weekly - 2-3 sets - 30-45 seconds hold - Staggered Sit-to-Stand  - 1 x daily - 7 x weekly - 2-3 sets - 10 reps  GOALS: Goals reviewed with patient? Yes  SHORT TERM GOALS: Target date: 12/28/2024   Pt will be independent with initial HEP for improved strength, balance, transfers and gait.  Baseline: not established on eval. 75% compliance Goal status: goal met  2.  Pt will improve score on ABC Scale to >/= 55% for improved confidence in abilities and return to PLOF  Baseline: 42.5% (12/2); 73.75% (12/31/24) Goal status: Goal met  3.  Pt will improve gait velocity to at least 1.15 m/s for improved gait efficiency and reduced fall risk   Baseline: 1.08 m/s  Goal status: not assessed   LONG TERM GOALS: Target date: 01/28/2025     Pt will be independent with final HEP for improved strength, balance, transfers and gait.  Baseline: 75% compliant Goal status: progressing  2.  Pt will improve score on ABC Scale to >/= 68% for improved confidence in abilities and return to PLOF  Baseline: 42.5% (12/2); 73.75% (12/31/24) Goal status: goal met  3.  Pt  will improve FGA to 25/30 for decreased fall risk   Baseline: 22/30 (12/2) Goal status: INITIAL  4.  Pt will improve gait velocity to at least 1.3 m/s for improved gait efficiency and community ambulation   Baseline: 1.08 m/s  Goal status: INITIAL   ASSESSMENT:  CLINICAL IMPRESSION: Today's session emphasized on reviewing goals, working on ankle and step strategies. Patient has demo improve confidence with functional activities overall per ABC scale. Patient still demo decreased  confidence with performing basic gait and balance activities and will benefit from continued skilled PT to improve gait speed, improve anticpatory postrual adjustments, step strategies to reduce fall risk.    OBJECTIVE IMPAIRMENTS: Abnormal gait, decreased activity tolerance, decreased balance, decreased coordination, decreased knowledge of condition, difficulty walking, decreased strength, impaired perceived functional ability, improper body mechanics, and pain  ACTIVITY LIMITATIONS: carrying, lifting, bending, squatting, stairs, transfers, reach over head, locomotion level, and caring for others  PARTICIPATION LIMITATIONS: driving, shopping, community activity, and yard work  PERSONAL FACTORS: Fitness, Past/current experiences, and frequent falls are also affecting patient's functional outcome.   REHAB POTENTIAL: Good  CLINICAL DECISION MAKING: Evolving/moderate complexity  EVALUATION COMPLEXITY: Moderate  PLAN:  PT FREQUENCY: 1-2x/week  PT DURATION: 4 weeks  PLANNED INTERVENTIONS: 97164- PT Re-evaluation, 97750- Physical Performance Testing, 97110-Therapeutic exercises, 97530- Therapeutic activity, [id number]- Neuromuscular re-education, 97535- Self Care, [id number]- Manual therapy, [id number]- Gait training, [phone]- Aquatic Therapy, [phone]- Electrical stimulation (manual), [phone] (1-2 muscles), 20561 (3+ muscles)- Dry Needling, Patient/Family education, Balance training, Stair training, Joint mobilization, Spinal  mobilization, Vestibular training, and DME instructions  PLAN FOR NEXT SESSION: Focus on following aspects from ABC scale (going down stairs, picking things off the floor, stepping up on stool and reaching for something) ankle and step strategies. Recert performed for 4 more weeks.   Raj LOISE Blanch, PT, DPT 12/31/2024, 11:20 AM        "

## 2024-12-31 NOTE — Telephone Encounter (Signed)
 Patient is returning a call to the nurse.

## 2025-01-03 NOTE — Telephone Encounter (Signed)
-----   Message from Curtistine ONEIDA Mulch sent at 12/27/2024  9:42 AM EST -----

## 2025-01-03 NOTE — Telephone Encounter (Signed)
 Patient advised of Results. Per patient she cancelled her EMG. She thought she didn't not need it.  Patient was transferred to the front desk to reschedule.

## 2025-01-07 ENCOUNTER — Ambulatory Visit: Admitting: Physical Therapy

## 2025-01-07 DIAGNOSIS — R2689 Other abnormalities of gait and mobility: Secondary | ICD-10-CM

## 2025-01-07 DIAGNOSIS — R2681 Unsteadiness on feet: Secondary | ICD-10-CM

## 2025-01-07 DIAGNOSIS — Z9181 History of falling: Secondary | ICD-10-CM

## 2025-01-07 DIAGNOSIS — R279 Unspecified lack of coordination: Secondary | ICD-10-CM

## 2025-01-07 NOTE — Therapy (Signed)
 " OUTPATIENT PHYSICAL THERAPY NEURO TREATMENT   Patient Name: Kristy Morse MRN: [id number] DOB:July 18, 1961, 64 y.o., female Today's Date: 01/07/2025   PCP: Hugh Charleston, MD REFERRING PROVIDER: Arnaldo Juliene RAMAN, MD  END OF SESSION:  PT End of Session - 01/07/25 1445     Visit Number 6    Number of Visits 13    Date for Recertification  01/18/25    Authorization Type [employer]    PT Start Time 1444    PT Stop Time 1526    PT Time Calculation (min) 42 min    Equipment Utilized During Treatment Gait belt    Activity Tolerance Patient tolerated treatment well    Behavior During Therapy WFL for tasks assessed/performed            Past Medical History:  Diagnosis Date   GERD (gastroesophageal reflux disease)    Sarcoidosis    Past Surgical History:  Procedure Laterality Date   ABDOMINAL HYSTERECTOMY     Patient Active Problem List   Diagnosis Date Noted   SOB (shortness of breath) 08/07/2018   Sarcoidosis 05/27/2017    ONSET DATE: 11/12/2024 (referral)   REFERRING DIAG:    M25.551 (ICD-10-CM) - Pain in right hip    THERAPY DIAG:  Unsteadiness on feet  History of falling  Other abnormalities of gait and mobility  Unspecified lack of coordination  Rationale for Evaluation and Treatment: Rehabilitation  SUBJECTIVE:                                                                                                                                                                                             SUBJECTIVE STATEMENT: Pt reports doing well. No falls. HEP is going well, still challenged by tandem stance in corner. Talked to her landlord about installing rails on her steps, but has not heard back.    Pt accompanied by: self  PERTINENT HISTORY: GERD, sarcoidosis   PAIN:  Are you having pain? Has random occurrence of R hip pain, denied pain today.   PRECAUTIONS: Fall  RED FLAGS: None   WEIGHT BEARING RESTRICTIONS: No  FALLS: Has patient fallen in last  6 months? Yes. Number of falls 5  LIVING ENVIRONMENT: Lives with: lives alone Lives in: House/apartment Stairs: Yes: External: 4 steps; none Has following equipment at home: None  PLOF: Independent  PATIENT GOALS: to get back to walk normal (as close as I can)   OBJECTIVE:  Note: Objective measures were completed at Evaluation unless otherwise noted.  DIAGNOSTIC FINDINGS: None relevant on file   COGNITION: Overall cognitive status: Within functional limits for tasks assessed  SENSATION: Pt denies numbness/tingling in BLEs  COORDINATION: Heel to shin test: WNL bilaterally  Finger to nose test: mild dysmetria noted bilaterally (L>R)  RAMS: WNL for BUE, Dysdiadochokinesia present in BLEs     POSTURE: No Significant postural limitations  LOWER EXTREMITY ROM:     Active  Right Eval Left Eval  Hip flexion    Hip extension    Hip abduction    Hip adduction    Hip internal rotation    Hip external rotation    Knee flexion    Knee extension    Ankle dorsiflexion    Ankle plantarflexion    Ankle inversion    Ankle eversion     (Blank rows = not tested)  LOWER EXTREMITY MMT:  Tested in seated position   MMT Right Eval Left Eval  Hip flexion 4 4-  Hip extension    Hip abduction 4+ 4+  Hip adduction 4+ 4+  Hip internal rotation    Hip external rotation    Knee flexion 4+ 4+  Knee extension 5 5  Ankle dorsiflexion 5 5  Ankle plantarflexion    Ankle inversion    Ankle eversion    (Blank rows = not tested)  BED MOBILITY:  Not tested Pt reports independence w/this   TRANSFERS: Sit to stand: Modified independence  Assistive device utilized: None     Stand to sit: Modified independence  Assistive device utilized: None      RAMP:  Not tested  CURB:  Not tested  STAIRS: Findings: Comments: See FGA GAIT: Gait pattern: step through pattern, lateral hip instability, decreased trunk rotation, and wide BOS Distance walked: Various clinic distances   Assistive device utilized: None Level of assistance: Modified independence Comments: Inconsistent foot placement noted of LLE    FUNCTIONAL TESTS:      PATIENT SURVEYS:  ABC scale: The Activities-Specific Balance Confidence (ABC) Scale 0% 10 20 30  40 50 60 70 80 90 100% No confidence<->completely confident  How confident are you that you will not lose your balance or become unsteady when you . . .   Date tested 11/30/24 12/31/24  Walk around the house 50% 80%  2. Walk up or down stairs 70% 50%  3. Bend over and pick up a slipper from in front of a closet floor 20% 60%  4. Reach for a small can off a shelf at eye level 0% 100%  5. Stand on tip toes and reach for something above your head 20% 90%  6. Stand on a chair and reach for something 80% 50%  7. Sweep the floor 10% 100%  8. Walk outside the house to a car parked in the driveway 50% 80%  9. Get into or out of a car 20% 100%  10. Walk across a parking lot to the mall 60% 90%  11. Walk up or down a ramp 40% 80%  12. Walk in a crowded mall where people rapidly walk past you 20% 70%  13. Are bumped into by people as you walk through the mall 40% 70%  14. Step onto or off of an escalator while you are holding onto the railing 80% 50%  15. Step onto or off an escalator while holding onto parcels such that you cannot hold onto the railing 40% 50%  16. Walk outside on icy sidewalks 80% 50  Total: #/16 680/16 = 42.5% 73.75%     VITALS  There were no vitals filed for this visit.  TREATMENT:   Ther Act  SciFit multi-peaks level 8.5 for 8 minutes using BUE/BLEs for neural priming for reciprocal movement, dynamic cardiovascular warmup and increased amplitude of stepping. RPE of 9/10 following activity  For improved functional hip strength, single leg stability, LE coordination and posterior chain strength:   Fwd/retro/lateral resisted stepping w/green theraband around ankles, x3 reps each direction. Added to HEP (see bolded below).  RDLS w15# KB, 1x15 and 1x12 reps. Mod multimodal cues to facilitate hip hinge and maintain neutral spine w/activity.  Quadruped bird dogs, x8 reps per side. Very challenging for pt and required mod A to stabilize and to maintain reciprocal coordination, so regressed to alt hip extension in quadruped position, x8 reps per side. Cued pt to hold for 2-3s at top of rep for added hip stability. Pt able to perform mod I, so added to HEP (see bolded below)  6 Blaze pods on random reach setting for improved LE coordination, single leg stability and step clearance.  Performed on 2 minute intervals with 30s rest periods.  Pt requires SBA guarding. Round 1:  3 pods placed on first step and 3 pods placed on second step setup.  49 hits. Round 2:  same setup.  58 hits. Notable errors/deficits:  Pt required BUE support to perform despite cues not to rely on BUE support. Min cues to tap w/LLE, as pt only using RLE initially    PATIENT EDUCATION: Education details: updates HEP  Person educated: Patient Education method: Explanation, Demonstration, Tactile cues, Verbal cues, and Handouts Education comprehension: verbalized understanding, returned demonstration, verbal cues required, tactile cues required, and needs further education  HOME EXERCISE PROGRAM: Access Code: [email or web address] URL: [email or web address] Date: 12/10/2024 Prepared by: Marlon Chirag Krueger  Exercises - Backward Walking with Counter Support  - 1 x daily - 7 x weekly - 3-5 sets - Tandem Stance in Corner  - 1 x daily - 7 x weekly - 3 sets - 30-45 seconds hold - Corner Balance Feet Together: Eyes Closed With Head Turns  - 1 x daily - 7 x weekly - 2-3 sets - 30-45 seconds hold - Staggered Sit-to-Stand  - 1 x daily - 7 x weekly - 2-3 sets - 10 reps - Forward and Backward Monster Walk with Resistance at Ankles and  Counter Support  - 1 x daily - 7 x weekly - 3 sets - 10 reps - Side Stepping with Resistance at Ankles and Counter Support  - 1 x daily - 7 x weekly - 3 sets - 10 reps - Beginner Front Arm Support  - 1 x daily - 7 x weekly - 3 sets - 10 reps  GOALS: Goals reviewed with patient? Yes  SHORT TERM GOALS: Target date: 12/28/2024   Pt will be independent with initial HEP for improved strength, balance, transfers and gait.  Baseline: not established on eval. 75% compliance Goal status: goal met  2.  Pt will improve score on ABC Scale to >/= 55% for improved confidence in abilities and return to PLOF  Baseline: 42.5% (12/2); 73.75% (12/31/24) Goal status: Goal met  3.  Pt will improve gait velocity to at least 1.15 m/s for improved gait efficiency and reduced fall risk   Baseline: 1.08 m/s  Goal status: not assessed   LONG TERM GOALS: Target date: 01/28/2025     Pt will be independent with final HEP for improved strength, balance, transfers and gait.  Baseline: 75% compliant Goal status: progressing  2.  Pt will improve score on ABC  Scale to >/= 68% for improved confidence in abilities and return to PLOF  Baseline: 42.5% (12/2); 73.75% (12/31/24) Goal status: Goal Met  3.  Pt will improve FGA to 25/30 for decreased fall risk   Baseline: 22/30 (12/2) Goal status: INITIAL  4.  Pt will improve gait velocity to at least 1.3 m/s for improved gait efficiency and community ambulation   Baseline: 1.08 m/s  Goal status: INITIAL   ASSESSMENT:  CLINICAL IMPRESSION: Emphasis of skilled PT session on posterior chain strength, single leg stability, step clearance and functional hip strength. Pt reports she did talk to her landlord about installing rails on her steps, but has not heard back. Pt very challenged by bird dog activity due to inability to maintain reciprocal coordination and weakness of hips, so regressed to alt hip extensions to work on posterior chain strength. Will assess LTGs  next session and determine if pt would like to DC or add more visits next session. Continue POC.    OBJECTIVE IMPAIRMENTS: Abnormal gait, decreased activity tolerance, decreased balance, decreased coordination, decreased knowledge of condition, difficulty walking, decreased strength, impaired perceived functional ability, improper body mechanics, and pain  ACTIVITY LIMITATIONS: carrying, lifting, bending, squatting, stairs, transfers, reach over head, locomotion level, and caring for others  PARTICIPATION LIMITATIONS: driving, shopping, community activity, and yard work  PERSONAL FACTORS: Fitness, Past/current experiences, and frequent falls are also affecting patient's functional outcome.   REHAB POTENTIAL: Good  CLINICAL DECISION MAKING: Evolving/moderate complexity  EVALUATION COMPLEXITY: Moderate  PLAN:  PT FREQUENCY: 1-2x/week  PT DURATION: 4 weeks  PLANNED INTERVENTIONS: 97164- PT Re-evaluation, 97750- Physical Performance Testing, 97110-Therapeutic exercises, 97530- Therapeutic activity, [id number]- Neuromuscular re-education, 97535- Self Care, 02859- Manual therapy, [id number]- Gait training, [phone]- Aquatic Therapy, [phone]- Electrical stimulation (manual), [phone] (1-2 muscles), 20561 (3+ muscles)- Dry Needling, Patient/Family education, Balance training, Stair training, Joint mobilization, Spinal mobilization, Vestibular training, and DME instructions  PLAN FOR NEXT SESSION: LTGs. Focus on following aspects from ABC scale (going down stairs, picking things off the floor, stepping up on stool and reaching for something) ankle and step strategies. Recert performed for 4 more weeks.   Kmari Brian E Ozan Maclay, PT, DPT 01/07/2025, 3:31 PM        "

## 2025-01-11 ENCOUNTER — Ambulatory Visit: Admitting: Physical Therapy

## 2025-01-11 DIAGNOSIS — R279 Unspecified lack of coordination: Secondary | ICD-10-CM

## 2025-01-11 DIAGNOSIS — Z9181 History of falling: Secondary | ICD-10-CM

## 2025-01-11 DIAGNOSIS — R2681 Unsteadiness on feet: Secondary | ICD-10-CM

## 2025-01-11 DIAGNOSIS — R2689 Other abnormalities of gait and mobility: Secondary | ICD-10-CM

## 2025-01-11 DIAGNOSIS — M6281 Muscle weakness (generalized): Secondary | ICD-10-CM

## 2025-01-11 NOTE — Therapy (Signed)
 " OUTPATIENT PHYSICAL THERAPY NEURO TREATMENT - DISCHARGE SUMMARY    Patient Name: Kristy Morse MRN: [id number] DOB:1961-07-03, 64 y.o., female Today's Date: 01/11/2025   PCP: Hugh Charleston, MD REFERRING PROVIDER: Arnaldo Juliene RAMAN, MD  PHYSICAL THERAPY DISCHARGE SUMMARY  Visits from Start of Care: 7  Current functional level related to goals / functional outcomes: Pt is mod I w/all ADLS without AD    Remaining deficits: Mild LLE weakness, low fall risk    Education / Equipment: HEP   Patient agrees to discharge. Patient goals were met. Patient is being discharged due to being pleased with the current functional level.   END OF SESSION:  PT End of Session - 01/11/25 1449     Visit Number 7    Number of Visits 13    Date for Recertification  01/18/25    Authorization Type [employer]    PT Start Time 1448    PT Stop Time 1528    PT Time Calculation (min) 40 min    Equipment Utilized During Treatment Gait belt    Activity Tolerance Patient tolerated treatment well    Behavior During Therapy WFL for tasks assessed/performed             Past Medical History:  Diagnosis Date   GERD (gastroesophageal reflux disease)    Sarcoidosis    Past Surgical History:  Procedure Laterality Date   ABDOMINAL HYSTERECTOMY     Patient Active Problem List   Diagnosis Date Noted   SOB (shortness of breath) 08/07/2018   Sarcoidosis 05/27/2017    ONSET DATE: 11/12/2024 (referral)   REFERRING DIAG:    M25.551 (ICD-10-CM) - Pain in right hip    THERAPY DIAG:  Unsteadiness on feet  History of falling  Other abnormalities of gait and mobility  Muscle weakness (generalized)  Unspecified lack of coordination  Rationale for Evaluation and Treatment: Rehabilitation  SUBJECTIVE:                                                                                                                                                                                             SUBJECTIVE  STATEMENT: Pt reports doing well. No falls. HEP is going well, really enjoys the banded walks. Feels ready to DC today. I understand progress will just take time    Pt accompanied by: self  PERTINENT HISTORY: GERD, sarcoidosis   PAIN:  Are you having pain? Has random occurrence of R hip pain, denied pain today.   PRECAUTIONS: Fall  RED FLAGS: None   WEIGHT BEARING RESTRICTIONS: No  FALLS: Has patient fallen in last 6 months? Yes. Number of  falls 5  LIVING ENVIRONMENT: Lives with: lives alone Lives in: House/apartment Stairs: Yes: External: 4 steps; none Has following equipment at home: None  PLOF: Independent  PATIENT GOALS: to get back to walk normal (as close as I can)   OBJECTIVE:  Note: Objective measures were completed at Evaluation unless otherwise noted.  DIAGNOSTIC FINDINGS: None relevant on file   COGNITION: Overall cognitive status: Within functional limits for tasks assessed   SENSATION: Pt denies numbness/tingling in BLEs  COORDINATION: Heel to shin test: WNL bilaterally  Finger to nose test: mild dysmetria noted bilaterally (L>R)  RAMS: WNL for BUE, Dysdiadochokinesia present in BLEs     POSTURE: No Significant postural limitations  LOWER EXTREMITY ROM:     Active  Right Eval Left Eval  Hip flexion    Hip extension    Hip abduction    Hip adduction    Hip internal rotation    Hip external rotation    Knee flexion    Knee extension    Ankle dorsiflexion    Ankle plantarflexion    Ankle inversion    Ankle eversion     (Blank rows = not tested)  LOWER EXTREMITY MMT:  Tested in seated position   MMT Right Eval Left Eval  Hip flexion 4 4-  Hip extension    Hip abduction 4+ 4+  Hip adduction 4+ 4+  Hip internal rotation    Hip external rotation    Knee flexion 4+ 4+  Knee extension 5 5  Ankle dorsiflexion 5 5  Ankle plantarflexion    Ankle inversion    Ankle eversion    (Blank rows = not tested)  BED MOBILITY:  Not  tested Pt reports independence w/this   TRANSFERS: Sit to stand: Modified independence  Assistive device utilized: None     Stand to sit: Modified independence  Assistive device utilized: None      RAMP:  Not tested  CURB:  Not tested  STAIRS: Findings: Comments: See FGA GAIT: Gait pattern: step through pattern, lateral hip instability, decreased trunk rotation, and wide BOS Distance walked: Various clinic distances  Assistive device utilized: None Level of assistance: Modified independence Comments: Inconsistent foot placement noted of LLE    FUNCTIONAL TESTS:   [facility] PT Assessment - 01/11/25 1500       Balance   Balance Assessed Yes      Standardized Balance Assessment   Standardized Balance Assessment 10 meter walk test    10 Meter Walk 1.23 m/s   58m over 8.12s no AD     Functional Gait  Assessment   Gait assessed  Yes    Gait Level Surface Walks 20 ft in less than 5.5 sec, no assistive devices, good speed, no evidence for imbalance, normal gait pattern, deviates no more than 6 in outside of the 12 in walkway width.   5.5s   Change in Gait Speed Able to smoothly change walking speed without loss of balance or gait deviation. Deviate no more than 6 in outside of the 12 in walkway width.    Gait with Horizontal Head Turns Performs head turns smoothly with no change in gait. Deviates no more than 6 in outside 12 in walkway width    Gait with Vertical Head Turns Performs head turns with no change in gait. Deviates no more than 6 in outside 12 in walkway width.    Gait and Pivot Turn Pivot turns safely within 3 sec and stops quickly with no loss of  balance.    Step Over Obstacle Is able to step over one shoe box (4.5 in total height) without changing gait speed. No evidence of imbalance.    Gait with Narrow Base of Support Ambulates 7-9 steps.    Gait with Eyes Closed Walks 20 ft, no assistive devices, good speed, no evidence of imbalance, normal gait pattern, deviates no  more than 6 in outside 12 in walkway width. Ambulates 20 ft in less than 7 sec.   6.9s   Ambulating Backwards Walks 20 ft, slow speed, abnormal gait pattern, evidence for imbalance, deviates 10-15 in outside 12 in walkway width.   16.87s w/imporved coordination   Steps Alternating feet, must use rail.    Total Score 25    FGA comment: 25/30, low fall risk            PATIENT SURVEYS:  ABC scale: The Activities-Specific Balance Confidence (ABC) Scale 0% 10 20 30  40 50 60 70 80 90 100% No confidence<->completely confident  How confident are you that you will not lose your balance or become unsteady when you . . .   Date tested 11/30/24 12/31/24  Walk around the house 50% 80%  2. Walk up or down stairs 70% 50%  3. Bend over and pick up a slipper from in front of a closet floor 20% 60%  4. Reach for a small can off a shelf at eye level 0% 100%  5. Stand on tip toes and reach for something above your head 20% 90%  6. Stand on a chair and reach for something 80% 50%  7. Sweep the floor 10% 100%  8. Walk outside the house to a car parked in the driveway 50% 80%  9. Get into or out of a car 20% 100%  10. Walk across a parking lot to the mall 60% 90%  11. Walk up or down a ramp 40% 80%  12. Walk in a crowded mall where people rapidly walk past you 20% 70%  13. Are bumped into by people as you walk through the mall 40% 70%  14. Step onto or off of an escalator while you are holding onto the railing 80% 50%  15. Step onto or off an escalator while holding onto parcels such that you cannot hold onto the railing 40% 50%  16. Walk outside on icy sidewalks 80% 50  Total: #/16 680/16 = 42.5% 73.75%     VITALS  There were no vitals filed for this visit.                                                                                                                               TREATMENT:   Ther Act/Physical Performance  Discussed PT DC and progress in PT thus far. Encouraged pt to focus on  being consistent w/HEP and walking program, as this is the best way to improve strength and endurance. Encouraged pt to return to PT following  her EMG if needed or if she notices worsening balance/weakness.    [facility] PT Assessment - 01/11/25 1500       Balance   Balance Assessed Yes      Standardized Balance Assessment   Standardized Balance Assessment 10 meter walk test    10 Meter Walk 1.23 m/s   75m over 8.12s no AD     Functional Gait  Assessment   Gait assessed  Yes    Gait Level Surface Walks 20 ft in less than 5.5 sec, no assistive devices, good speed, no evidence for imbalance, normal gait pattern, deviates no more than 6 in outside of the 12 in walkway width.   5.5s   Change in Gait Speed Able to smoothly change walking speed without loss of balance or gait deviation. Deviate no more than 6 in outside of the 12 in walkway width.    Gait with Horizontal Head Turns Performs head turns smoothly with no change in gait. Deviates no more than 6 in outside 12 in walkway width    Gait with Vertical Head Turns Performs head turns with no change in gait. Deviates no more than 6 in outside 12 in walkway width.    Gait and Pivot Turn Pivot turns safely within 3 sec and stops quickly with no loss of balance.    Step Over Obstacle Is able to step over one shoe box (4.5 in total height) without changing gait speed. No evidence of imbalance.    Gait with Narrow Base of Support Ambulates 7-9 steps.    Gait with Eyes Closed Walks 20 ft, no assistive devices, good speed, no evidence of imbalance, normal gait pattern, deviates no more than 6 in outside 12 in walkway width. Ambulates 20 ft in less than 7 sec.   6.9s   Ambulating Backwards Walks 20 ft, slow speed, abnormal gait pattern, evidence for imbalance, deviates 10-15 in outside 12 in walkway width.   16.87s w/imporved coordination   Steps Alternating feet, must use rail.    Total Score 25    FGA comment: 25/30, low fall risk          Reviewed  monster walks and updated to blue resistance band, x2 reps in fwd/lateral/retro direction. Mod multimodal cues for LARGE steps and to avoid sliding feet w/movement. Updated HEP and provided pt w/finalized copy.  For improved stability w/reaching to floor and posterior chain strength, deadlifts w/12# KB x12 reps. Min multimodal cues to facilitate hip hinge, keep weight close to body and maintain neutral spine. No instability noted w/movement and pt reported feeling good w/reaching to ground.   PATIENT EDUCATION: Education details: finalized HEP and walking program, goal results, encouragement to return to PT in future if needed.  Person educated: Patient Education method: Explanation, Demonstration, Tactile cues, Verbal cues, and Handouts Education comprehension: verbalized understanding, returned demonstration, verbal cues required, tactile cues required, and needs further education  HOME EXERCISE PROGRAM: Access Code: [email or web address] URL: [email or web address] Date: 01/11/2025 Prepared by: Marlon Yeison Sippel  Program Notes You Can Walk For A Certain Length Of Time Each Day     Walk 15 minutes 2 times per day.             Increase 2-3  minutes every 7 days            Work up to 30 minutes (1-2 times per day).  Exercises - Backward Walking with Counter Support  - 1 x daily - 7 x weekly - 3-5 sets - Tandem  Stance in Corner  - 1 x daily - 7 x weekly - 3 sets - 30-45 seconds hold - Corner Balance Feet Together: Eyes Closed With Head Turns  - 1 x daily - 7 x weekly - 2-3 sets - 30-45 seconds hold - Staggered Sit-to-Stand  - 1 x daily - 7 x weekly - 2-3 sets - 10 reps - Forward and Backward Monster Walk with Resistance at Ankles and Counter Support  - 1 x daily - 7 x weekly - 3 sets - 10 reps - Side Stepping with Resistance at Ankles and Counter Support  - 1 x daily - 7 x weekly - 3 sets - 10 reps - Beginner Front Arm Support  - 1 x daily - 7 x weekly - 3 sets - 10 reps                GOALS: Goals reviewed with patient? Yes  SHORT TERM GOALS: Target date: 12/28/2024   Pt will be independent with initial HEP for improved strength, balance, transfers and gait.  Baseline: not established on eval. 75% compliance Goal status: goal met  2.  Pt will improve score on ABC Scale to >/= 55% for improved confidence in abilities and return to PLOF  Baseline: 42.5% (12/2); 73.75% (12/31/24) Goal status: Goal met  3.  Pt will improve gait velocity to at least 1.15 m/s for improved gait efficiency and reduced fall risk   Baseline: 1.08 m/s  Goal status: not assessed   LONG TERM GOALS: Target date: 01/28/2025     Pt will be independent with final HEP for improved strength, balance, transfers and gait.  Baseline: 75% compliant Goal status: MET  2.  Pt will improve score on ABC Scale to >/= 68% for improved confidence in abilities and return to PLOF  Baseline: 42.5% (12/2); 73.75% (12/31/24) Goal status: Goal Met  3.  Pt will improve FGA to 25/30 for decreased fall risk   Baseline: 22/30 (12/2); 25/30 (1/13) Goal status: MET  4.  Pt will improve gait velocity to at least 1.3 m/s for improved gait efficiency and community ambulation   Baseline: 1.08 m/s; 1.48m/s no AD (1/13) Goal status: PARTIALLY MET    ASSESSMENT:  CLINICAL IMPRESSION: Emphasis of skilled PT session on LTG assessment and DC from PT. Pt has met 3 of 4 goals and partially met 1 of 4 goals. Pt has demonstrated significant improvement in her confidence w/mobility, improving her ABC scale from 42.5% to 73.75%. Pt also improved her score on FGA to 25/30, placing her at a low fall risk. Pt continues to be self-limiting w/higher level balance tasks due to fear of falling but has improved w/facilitation of ankle and stepping strategy and reports increased confidence that she can correct her balance if she stumbles. Pt did improve her gait speed without AD, but narrowly missed her goal speed, so consider goal  partially met. Overall pt doing well and is mod I w/all ADLS without use of AD. Pt reports feeling confident in her abilities and would like to work on being consistent w/her HEP at home. Pt encouraged to return to PT in future if needed and was agreeable to DC today.     OBJECTIVE IMPAIRMENTS: Abnormal gait, decreased activity tolerance, decreased balance, decreased coordination, decreased knowledge of condition, difficulty walking, decreased strength, impaired perceived functional ability, improper body mechanics, and pain  ACTIVITY LIMITATIONS: carrying, lifting, bending, squatting, stairs, transfers, reach over head, locomotion level, and caring for others  PARTICIPATION LIMITATIONS:  driving, shopping, community activity, and yard work  PERSONAL FACTORS: Fitness, Past/current experiences, and frequent falls are also affecting patient's functional outcome.   REHAB POTENTIAL: Good  CLINICAL DECISION MAKING: Evolving/moderate complexity  EVALUATION COMPLEXITY: Moderate  PLAN:  PT FREQUENCY: 1-2x/week  PT DURATION: 4 weeks  PLANNED INTERVENTIONS: 97164- PT Re-evaluation, 97750- Physical Performance Testing, 97110-Therapeutic exercises, 97530- Therapeutic activity, 97112- Neuromuscular re-education, 97535- Self Care, [id number]- Manual therapy, [phone]- Gait training, [phone]- Aquatic Therapy, [phone]- Electrical stimulation (manual), [phone] (1-2 muscles), [phone] (3+ muscles)- Dry Needling, Patient/Family education, Balance training, Stair training, Joint mobilization, Spinal mobilization, Vestibular training, and DME instructions    Marlon BRAVO Gurbani Figge, PT, DPT 01/11/2025, 3:28 PM        "

## 2025-01-21 ENCOUNTER — Ambulatory Visit
Admission: RE | Admit: 2025-01-21 | Discharge: 2025-01-21 | Disposition: A | Source: Ambulatory Visit | Attending: Obstetrics | Admitting: Obstetrics

## 2025-01-21 DIAGNOSIS — Z1231 Encounter for screening mammogram for malignant neoplasm of breast: Secondary | ICD-10-CM

## 2025-02-28 ENCOUNTER — Encounter: Payer: Self-pay | Admitting: Neurology

## 2025-03-14 ENCOUNTER — Encounter: Admitting: Neurology
